# Patient Record
Sex: Female | Born: 1970 | Race: White | Hispanic: No | Marital: Married | Smoking: Former smoker
Health system: Southern US, Community
[De-identification: ages and names within clinical notes are randomized; demographics above are authoritative.]

## PROBLEM LIST (undated history)

## (undated) DIAGNOSIS — O24419 Gestational diabetes mellitus in pregnancy, unspecified control: Secondary | ICD-10-CM

## (undated) DIAGNOSIS — E785 Hyperlipidemia, unspecified: Secondary | ICD-10-CM

## (undated) HISTORY — DX: Gestational diabetes mellitus in pregnancy, unspecified control: O24.419

## (undated) HISTORY — DX: Hyperlipidemia, unspecified: E78.5

---
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
## 1997-08-29 ENCOUNTER — Encounter: Admission: RE | Admit: 1997-08-29 | Discharge: 1997-08-29 | Payer: Self-pay | Admitting: Family Medicine

## 1997-11-12 ENCOUNTER — Other Ambulatory Visit: Admission: RE | Admit: 1997-11-12 | Discharge: 1997-11-12 | Payer: Self-pay | Admitting: Obstetrics and Gynecology

## 1998-01-25 HISTORY — PX: TUBAL LIGATION: SHX77

## 1998-05-29 ENCOUNTER — Other Ambulatory Visit: Admission: RE | Admit: 1998-05-29 | Discharge: 1998-05-29 | Payer: Self-pay | Admitting: *Deleted

## 1998-10-24 ENCOUNTER — Encounter: Admission: RE | Admit: 1998-10-24 | Discharge: 1999-01-22 | Payer: Self-pay | Admitting: Obstetrics and Gynecology

## 1998-12-04 ENCOUNTER — Inpatient Hospital Stay (HOSPITAL_COMMUNITY): Admission: AD | Admit: 1998-12-04 | Discharge: 1998-12-09 | Payer: Self-pay | Admitting: Obstetrics and Gynecology

## 1998-12-04 ENCOUNTER — Encounter (INDEPENDENT_AMBULATORY_CARE_PROVIDER_SITE_OTHER): Payer: Self-pay

## 1998-12-05 ENCOUNTER — Encounter: Payer: Self-pay | Admitting: Obstetrics and Gynecology

## 1999-03-30 ENCOUNTER — Encounter: Admission: RE | Admit: 1999-03-30 | Discharge: 1999-03-30 | Payer: Self-pay | Admitting: Family Medicine

## 1999-04-20 ENCOUNTER — Encounter: Admission: RE | Admit: 1999-04-20 | Discharge: 1999-04-20 | Payer: Self-pay | Admitting: Family Medicine

## 1999-09-11 ENCOUNTER — Encounter: Admission: RE | Admit: 1999-09-11 | Discharge: 1999-09-11 | Payer: Self-pay | Admitting: Family Medicine

## 2001-09-25 ENCOUNTER — Encounter (INDEPENDENT_AMBULATORY_CARE_PROVIDER_SITE_OTHER): Payer: Self-pay | Admitting: *Deleted

## 2001-09-25 LAB — CONVERTED CEMR LAB

## 2001-10-05 ENCOUNTER — Encounter: Admission: RE | Admit: 2001-10-05 | Discharge: 2001-10-05 | Payer: Self-pay | Admitting: Family Medicine

## 2002-07-20 ENCOUNTER — Encounter: Admission: RE | Admit: 2002-07-20 | Discharge: 2002-07-20 | Payer: Self-pay | Admitting: Family Medicine

## 2002-08-23 ENCOUNTER — Encounter: Payer: Self-pay | Admitting: Internal Medicine

## 2002-08-23 ENCOUNTER — Encounter: Admission: RE | Admit: 2002-08-23 | Discharge: 2002-08-23 | Payer: Self-pay | Admitting: Internal Medicine

## 2002-10-02 ENCOUNTER — Encounter: Payer: Self-pay | Admitting: Internal Medicine

## 2002-10-02 ENCOUNTER — Encounter: Admission: RE | Admit: 2002-10-02 | Discharge: 2002-10-02 | Payer: Self-pay | Admitting: Internal Medicine

## 2005-03-28 ENCOUNTER — Emergency Department (HOSPITAL_COMMUNITY): Admission: EM | Admit: 2005-03-28 | Discharge: 2005-03-29 | Payer: Self-pay | Admitting: Emergency Medicine

## 2006-03-25 ENCOUNTER — Encounter (INDEPENDENT_AMBULATORY_CARE_PROVIDER_SITE_OTHER): Payer: Self-pay | Admitting: *Deleted

## 2008-06-10 ENCOUNTER — Other Ambulatory Visit: Admission: RE | Admit: 2008-06-10 | Discharge: 2008-06-10 | Payer: Self-pay | Admitting: Obstetrics and Gynecology

## 2010-01-25 DIAGNOSIS — E785 Hyperlipidemia, unspecified: Secondary | ICD-10-CM

## 2010-01-25 HISTORY — DX: Hyperlipidemia, unspecified: E78.5

## 2010-06-12 NOTE — Discharge Summary (Signed)
[facility]  Patient:    Karen Diaz                     MRN: [id number] Adm. Date:  [id number] Disc. Date: 12/09/98 Attending:  Shaune Spittle Dictator:   Vance Gather Duplantis, C.N.M.                           Discharge Summary  ADMISSION DIAGNOSES:          1. Intrauterine pregnancy at 35-5/7ths weeks.                               2. Pre-eclampsia.                               3. Gestational diabetes, diet controlled.                               4. History of placenta previa with this pregnancy,                                  resolved at 25 weeks.                               5. History of prior cesarean section with wound                                  disruption.                               6. Desires repeat cesarean section.                               7. Multiparity.                               8. Desires bilateral tubal ligation.  DISCHARGE DIAGNOSES:          1. Intrauterine pregnancy at 35-5/7ths weeks.                               2. Pre-eclampsia, resolving.                               3. Gestational diabetes, diet controlled.                               4. History of placenta previa with this pregnancy,                                  resolved at 25 weeks.  5. History of prior cesarean section with wound                                  disruption.                               6. Desires repeat cesarean section.                               7. Multiparity.                               8. Desires bilateral tubal ligation.                               9. Bottle feeding.  PROCEDURES THIS ADMISSION:    1. Repeat low transverse cesarean section for                                  delivery of a viable female infant with Apgars 8 and                                  9, weighing 4 pounds 11 ounces, by Dr. Trudie Reed on December 06, 1998.                             2. Bilateral tubal ligation for sterilization during                                  cesarean section on December 06, 1998, by                                  Dr. Trudie Reed.  HOSPITAL COURSE:              [name] is a 40 year old married white female, gravida 2, para 0-1-0-1, at 35-5/7ths weeks who was admitted with elevated blood pressures and proteinuria for magnesium sulfate therapy.  Her pregnancy has been complicated by gestational diabetes, history of pre-eclampsia and previous C-section with wound disruption.  An ultrasound on November 10 revealed less than 10th percentile growth of the fetus and no suitable pocket for amniocentesis. s a result a long discussion was held with the patient by Dr. Trudie Reed and the  plan was made to proceed with a cesarean section delivery when she was 36 weeks on December 06, 1998.  She was then delivered of a viable female infant who had Apgars of 8 and 9, weighed 4 pounds 11 ounces, by Dr. Trudie Reed on December 06, 1998. She also underwent bilateral tubal ligation for sterilization without complications during the same surgery.  Please see operative note for details. Postoperatively she was maintained  on magnesium sulfate for 24 hours and started diuresing well and had stable blood pressures.  Her blood pressures have continued to be stable off the magnesium sulfate and she is doing well.  She is ambulating, voiding and eating without difficulty.  She is afebrile.  She is also bottle feeding without difficulty.  She is deemed ready for discharge today.  DISCHARGE INSTRUCTIONS:       Per [facility] OB/GYN handout.  DISCHARGE FOLLOWUP:           To come by the office for staple removal on December 11, 1998, and for routine six-week postpartum appointment.  DISCHARGE LABORATORY DATA:    Platelets are 187, hemoglobin 10.4, WBC count is .6.  DISCHARGE MEDICATIONS:        1. Motrin 600  mg p.o. q.6h. p.r.n. pain.                               2. Tylox 1-2 p.o. q.4-6h. p.r.n. pain.                               3. Prenatal vitamins daily. DD:  12/09/98 TD:  12/09/98 Job: [id number]

## 2010-06-12 NOTE — Op Note (Signed)
[facility]  Patient:    Karen Diaz                     MRN: [id number] Proc. Date: 12/06/98 Adm. Date:  [id number] Attending:  Dierdre Forth Pearline                           Operative Report  PREOPERATIVE DIAGNOSIS:       Intrauterine pregnancy at 36 weeks, preeclampsia,  small for gestational age infant, grade III placenta, diet controlled gestational diabetes, previous cesarean section declining VBAC, and desiring permanent sterilization.  POSTOPERATIVE DIAGNOSIS:      Intrauterine pregnancy at 36 weeks, preeclampsia,  small for gestational age infant, grade III placenta, diet controlled gestational diabetes, previous cesarean section declining VBAC, and desiring permanent sterilization.  OPERATION:                    Repeat low transverse cesarean section and postpartum tubal ligation.  SURGEON:                      Georgina Peer, M.D.  ASSISTANTVance Gather Duplantis, C.N.M.  ANESTHESIA:                   Spinal by Gretta Cool., M.D.  ESTIMATED BLOOD LOSS:         800 cc.  COMPLICATIONS:                None.  FINDINGS:                     4 pound 11 ounce female at 64, Apgars 8 and 9. Tubes and ovaries normal.  INDICATIONS:                  The patient is a 40 year old, gravida 2, para 0-1-0-1, who presented with increased blood pressure and proteinuria, and headache with visual changes.  She was diagnosed with preeclampsia and started on magnesium sulfate.  The patient had normal PIH labs.  A 24-hour urine revealed greater than 300 mg but less than 1 gram of protein in the mild preeclampsia range.  Fetal heart rate was reactive.  Hemoglobin A1C was normal.  Fasting blood sugar was less than 100 and two-hour postprandial less than 125.  An ultrasound on November 10, revealed the infant to be greater than two weeks less than the expected gestational age with the fetal weight less than the  6th percentile.  With all of these factors, and the patient declining VBAC, a repeat cesarean section was planned.  DESCRIPTION OF PROCEDURE:     The patient was taken to the operating room and given a spinal anesthetic.  Foley catheter had been placed in her bladder.  She had been given 1 gram of Cefotan preoperatively.  After an adequate anesthesia and the patient being prepped and draped sterilely, a transverse incision was made above her previous scar which was wide.  This was taken down to the peritoneal cavity in the normal Pfannenstiel manner.  The peritoneum was opened sharply and widened.  Bleeders were cauterized.  Bladder blade was placed over the bladder after a transverse bladder flap was created.  A transverse uterine incision was made and membranes were ruptured yeilding clear fluid.  At 1419  a female infant was delivered with the aid of fundal pressure in the vertex presentation.  Female cried spontaneously.  The cord was doubly clamped and cut and the infant was handed to the pediatric team in attendance.  Cord blood was taken and the placenta was delivered and sent to pathology.  Apgars were assigned at 8 and 9.  The uterine  incision was closed with a running locked suture of Vicryl.  Interrupted sutures and mattress sutures of Vicryl were used to control bleeding along the incisional edge which was then hemostatic.  Tubal ligation was then performed by visualizing the left tube, tracing it to its fimbriated end.  A small cautery hole was placed in the mesosalpinx below the tube.  Two ties of plain gut were placed 3 cm apart and the midportion excised.  There was no bleeding.  The right tube had more prominent mesosalpingeal vessels.  Therefore a knuckle was elevated.  No hole was placed in the mesosalpinx.  Two plain ties were tied around the knuckle and the  midportion excised.  There was no bleeding.  The ovaries bilaterally appeared normal.  There appeared  to be a small desidual reaction on the left ovary. Again the incision was inspected and found to be hemostatic.  The blood and debris were removed with irrigation.  The muscles were approximated in the midline and the peritoneum was not closed.  The fascia was separated from the subcutaneous which had been scarred to allow better mobility and skin edge opposition.  The fascia was closed with Vicryl suture.  The old scar was excised and bleeders were cauterized. Subcutaneous tissue was approximated with interrupted sutures of plain gut. The skin was closed with skin staples.  Sponge, needle, and instrument counts were correct.  The patient tolerating the procedure well was transferred to the recovery room in good condition. DD:  12/06/98 TD:  12/08/98 Job: [id number]

## 2010-09-29 ENCOUNTER — Other Ambulatory Visit: Payer: Self-pay | Admitting: Obstetrics and Gynecology

## 2010-09-29 ENCOUNTER — Other Ambulatory Visit (HOSPITAL_COMMUNITY)
Admission: RE | Admit: 2010-09-29 | Discharge: 2010-09-29 | Disposition: A | Discharge status: Home / Self Care | Payer: BC Managed Care – PPO | Source: Ambulatory Visit | Attending: Obstetrics and Gynecology | Admitting: Obstetrics and Gynecology

## 2010-09-29 DIAGNOSIS — Z01419 Encounter for gynecological examination (general) (routine) without abnormal findings: Secondary | ICD-10-CM | POA: Insufficient documentation

## 2011-02-01 ENCOUNTER — Encounter: Payer: Self-pay | Admitting: Emergency Medicine

## 2011-02-01 ENCOUNTER — Ambulatory Visit (INDEPENDENT_AMBULATORY_CARE_PROVIDER_SITE_OTHER): Payer: BC Managed Care – PPO | Admitting: Emergency Medicine

## 2011-02-01 VITALS — BP 121/77 | HR 98 | Temp 98.0°F | Ht 63.0 in | Wt 129.0 lb

## 2011-02-01 DIAGNOSIS — N926 Irregular menstruation, unspecified: Secondary | ICD-10-CM

## 2011-02-01 DIAGNOSIS — E785 Hyperlipidemia, unspecified: Secondary | ICD-10-CM

## 2011-02-01 DIAGNOSIS — Z0000 Encounter for general adult medical examination without abnormal findings: Secondary | ICD-10-CM

## 2011-02-01 NOTE — Patient Instructions (Signed)
It was wonderful to meet you!  I would like to recheck your cholesterol next month.  Please schedule a lab appointment for the blood draw.  I will send a letter with the results and let you know if we need to make any changes.  I'm sorry to hear about your difficulties with your menstrual period.  I will follow along with your gynecologist.  Please let me know if I can help in any way.  When you turn 41, please call the office [phone]) for information on scheduling a screening colonoscopy.

## 2011-02-01 NOTE — Assessment & Plan Note (Signed)
Diagnosed in 08/2010.  Has made some lifestyle changes.  Will recheck lipid panel beginning of February.  If continues to be elevated will discuss referral to nutrition vs starting medication.  Goal LDL <160 given no other risk factors.

## 2011-02-01 NOTE — Assessment & Plan Note (Signed)
Given family of history of colon cancer, with 1st degree relative diagnosed around age 40, will start screening colonoscopy at age 49. Pt to call clinic after her 53th birthday for information on scheduling a colonoscopy.  Had pap smear at her gynecologist in 08/2010.

## 2011-02-01 NOTE — Assessment & Plan Note (Signed)
Currently being managed by Dr. Richardson Dopp of [facility].  Scheduled for ultrasound tomorrow.  Will continue to follow along.  Does have family history of menopause in late 30s/early 67s.

## 2011-02-01 NOTE — Progress Notes (Signed)
  Subjective:    Patient ID: Karen Diaz, female    DOB: [birth year], 41 y.o.   MRN: [id number]  HPI [name] is here to establish care.  1. Hyperlipidemia: This was diagnosed on a blood test in 08/2010.  Since then, she has started walking more (about 20 minutes a day) and has tried to eat healthier with no fried foods, and cutting out soda and mayonnaise. Would like to avoid medication if possible.  No history of hypertension or heart diease.  Never smoker.  Does have family history of heart diease in grandparents, but no first degree relative.  No current symptoms of CP, SOB, edema.  2. Irregular menses: Patient had some heavy bleeding in 10-11/2010.  Had a more normal period in December.  This is being workup by Karen Diaz, her gynecologist.  She has an ultrasound scheduled for tomorrow.  Recent pap was reported as normal.  Does have family history of menopause around age 77.  34. Family Hx of cancer: Strong family history of cancer, particularly colon cancer, in father, uncles, aunt, and cousin.   Review of Systems Please see HPI, otherwise negative.    Objective:   Physical Exam Vitals: reviewed HEENT: AT/[place], PERRL, EOMI, sclera white, MMM, oropharynx clear without erythema or exudate, TMs clear bilaterally, no cervical LAD CV: RRR, no murmurs Pulm: CTAB, no wheezes, rales Abd: soft, +BS, NTND, no hepatosplenomegaly Ext: no peripheral edema, 2+ pedal pulses      Assessment & Plan:

## 2011-03-11 ENCOUNTER — Other Ambulatory Visit: Payer: Self-pay | Admitting: Obstetrics and Gynecology

## 2011-03-11 DIAGNOSIS — Z1231 Encounter for screening mammogram for malignant neoplasm of breast: Secondary | ICD-10-CM

## 2011-03-11 DIAGNOSIS — Z803 Family history of malignant neoplasm of breast: Secondary | ICD-10-CM

## 2011-03-30 ENCOUNTER — Ambulatory Visit
Admission: RE | Admit: 2011-03-30 | Discharge: 2011-03-30 | Disposition: A | Discharge status: Home / Self Care | Payer: BC Managed Care – PPO | Source: Ambulatory Visit | Attending: Obstetrics and Gynecology | Admitting: Obstetrics and Gynecology

## 2011-03-30 DIAGNOSIS — Z803 Family history of malignant neoplasm of breast: Secondary | ICD-10-CM

## 2011-03-30 DIAGNOSIS — Z1231 Encounter for screening mammogram for malignant neoplasm of breast: Secondary | ICD-10-CM

## 2011-04-05 ENCOUNTER — Ambulatory Visit (INDEPENDENT_AMBULATORY_CARE_PROVIDER_SITE_OTHER): Payer: BC Managed Care – PPO | Admitting: Emergency Medicine

## 2011-04-05 ENCOUNTER — Encounter: Payer: Self-pay | Admitting: Emergency Medicine

## 2011-04-05 ENCOUNTER — Ambulatory Visit
Admission: RE | Admit: 2011-04-05 | Discharge: 2011-04-05 | Disposition: A | Discharge status: Home / Self Care | Payer: BC Managed Care – PPO | Source: Ambulatory Visit | Attending: Family Medicine | Admitting: Family Medicine

## 2011-04-05 VITALS — BP 118/78 | HR 83 | Ht 63.0 in | Wt 126.0 lb

## 2011-04-05 DIAGNOSIS — M25521 Pain in right elbow: Secondary | ICD-10-CM

## 2011-04-05 DIAGNOSIS — L989 Disorder of the skin and subcutaneous tissue, unspecified: Secondary | ICD-10-CM | POA: Insufficient documentation

## 2011-04-05 DIAGNOSIS — M25529 Pain in unspecified elbow: Secondary | ICD-10-CM

## 2011-04-05 MED ORDER — MELOXICAM 15 MG PO TABS: 15.0000 mg | ORAL_TABLET | Freq: Every day | ORAL | Status: DC

## 2011-04-05 NOTE — Assessment & Plan Note (Signed)
With history of elbow fracture in childhood there is concern for a piece of cartilage or bone to be in the joint space, as well as development of arthritis.  Will obtain complete x-ray of right elbow.  Will also start meloxicam 15mg  daily for 2 weeks than as needed.  Pt to follow up in 2 weeks to go over results of x-ray and decide on further action (injection, referral to [facility]).

## 2011-04-05 NOTE — Assessment & Plan Note (Signed)
Concern for basal cell carcinoma.  Given location of lesion, will refer to dermatology for biopsy.

## 2011-04-05 NOTE — Patient Instructions (Signed)
It was nice to see you, I'm sorry your having pain.  For the elbow - we are going to get some x-rays to look at the bones and joint.  There may be a piece of cartilage in the joint preventing you from straightening your arm.  I have also sent a prescription for meloxicam (super motrin) to [facility], please take this everyday for the next 2 weeks, then as needed.  I will see you back in clinic in 2-3 weeks to go over the x-rays and decide on the next step.  For the nose - I have placed a referral to dermatology for biopsy.  You should be getting a call to schedule the appointment in the next 2 weeks.

## 2011-04-05 NOTE — Progress Notes (Signed)
  Subjective:    Patient ID: Karen Diaz, female    DOB: Aug 05, 1970, 41 y.o.   MRN: [id number]  HPI [name] is here today for right elbow pain and a skin lesion.  1. Elbow pain: The pain is located on the posterior aspect of the elbow and is achy in nature.  Has been present for a long time, but worsening over the last 4-6 weeks.  There is pain on extreme flexion of the elbow and she is unable to completely straighten the elbow.  She did break her elbow when she was 6 and again when she was 7.  She does not recall where exactly the breaks were located.  The pain is worsened with movements and will spread to include her shoulder and wrist/thumb.  Occasionally has numbness/tingling in her thumb with excessive activity; feel like her hand is getting weaker.  Has tried tylenol, motrin, and heat at home with minimal to no improvement in pain.  2. Skin lesion: located on bridge of nose.  Has been present for about 1 year.  Thinks it has been getting darker.  States there is sometimes an underlying bump that comes and goes.  She does get lots of sun exposure, but she does use sunscreen.   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

## 2011-10-28 ENCOUNTER — Other Ambulatory Visit (HOSPITAL_COMMUNITY)
Admission: RE | Admit: 2011-10-28 | Discharge: 2011-10-28 | Disposition: A | Discharge status: Home / Self Care | Payer: BC Managed Care – PPO | Source: Ambulatory Visit | Attending: Obstetrics and Gynecology | Admitting: Obstetrics and Gynecology

## 2011-10-28 DIAGNOSIS — Z1151 Encounter for screening for human papillomavirus (HPV): Secondary | ICD-10-CM | POA: Insufficient documentation

## 2011-10-28 DIAGNOSIS — Z01419 Encounter for gynecological examination (general) (routine) without abnormal findings: Secondary | ICD-10-CM | POA: Insufficient documentation

## 2011-11-17 ENCOUNTER — Encounter: Payer: Self-pay | Admitting: Emergency Medicine

## 2011-11-17 ENCOUNTER — Ambulatory Visit (INDEPENDENT_AMBULATORY_CARE_PROVIDER_SITE_OTHER): Payer: BC Managed Care – PPO | Admitting: Emergency Medicine

## 2011-11-17 VITALS — BP 135/82 | HR 92 | Temp 98.2°F | Ht 62.5 in | Wt 127.0 lb

## 2011-11-17 DIAGNOSIS — J069 Acute upper respiratory infection, unspecified: Secondary | ICD-10-CM

## 2011-11-17 DIAGNOSIS — E785 Hyperlipidemia, unspecified: Secondary | ICD-10-CM

## 2011-11-17 DIAGNOSIS — Z0000 Encounter for general adult medical examination without abnormal findings: Secondary | ICD-10-CM

## 2011-11-17 LAB — LIPID PANEL
Cholesterol: 335 mg/dL — ABNORMAL HIGH (ref 0–200)
HDL: 64 mg/dL (ref >39)
LDL Cholesterol: 243 mg/dL — ABNORMAL HIGH (ref 0–99)
Total CHOL/HDL Ratio: 5.2 Ratio
Triglycerides: 139 mg/dL (ref <150)
VLDL: 28 mg/dL (ref 0–40)

## 2011-11-17 LAB — COMPREHENSIVE METABOLIC PANEL
ALT: 11 U/L (ref 0–35)
AST: 16 U/L (ref 0–37)
Albumin: 4.6 g/dL (ref 3.5–5.2)
Alkaline Phosphatase: 66 U/L (ref 39–117)
BUN: 7 mg/dL (ref 6–23)
CO2: 26 mEq/L (ref 19–32)
Calcium: 9.5 mg/dL (ref 8.4–10.5)
Chloride: 104 mEq/L (ref 96–112)
Creat: 0.62 mg/dL (ref 0.50–1.10)
Glucose, Bld: 97 mg/dL (ref 70–99)
Potassium: 4.3 mEq/L (ref 3.5–5.3)
Sodium: 138 mEq/L (ref 135–145)
Total Bilirubin: 0.6 mg/dL (ref 0.3–1.2)
Total Protein: 7.1 g/dL (ref 6.0–8.3)

## 2011-11-17 MED ORDER — FLUTICASONE PROPIONATE 50 MCG/ACT NA SUSP: 2.0000 | Freq: Every day | NASAL | Status: DC

## 2011-11-17 MED ORDER — SODIUM CHLORIDE 0.65 % NA SOLN: 2.0000 | Freq: Three times a day (TID) | NASAL | Status: DC

## 2011-11-17 NOTE — Patient Instructions (Signed)
It was nice to see you again! Please call [facility] about scheduling a colonoscopy.  You should not need a referral from me to do this.  If they say you do, just call the office and I will place the referral. I will send you a letter with the results of your lab work in the next 1-2 weeks.  If something is wrong, I will call you. For the cold symptoms - STOP using the Afrin spray - START using Flonase every day - START using nasal saline spray 3-4x per day to try and wash out the sinuses - if you start having fevers or the nasal discharge starts looking like pus or smelling really bad, give me a call and we will start some antibiotics.  Follow up as needed.

## 2011-11-17 NOTE — Progress Notes (Signed)
  Subjective:    Patient ID: Karen Diaz, female    DOB: 1970/12/30, 41 y.o.   MRN: [id number]  HPI Karen Diaz is here for annual exam and cold symptoms.  1. Cold symptoms: Reports having cold symptoms for the last month.  Initially started with chest congestion, cough and loss of voice x1 week.  Then it moved to her head with nasal congestion, pressure in the sinuses and intermittent ear discomfort.  Has tried OTC Sudafed, Afrin (none for last 4 days), tylenol cold and sinus and robitussin.  Nasal discharge is green in color.  Has had some chills, but no fevers.  I have reviewed and updated the following as appropriate: allergies and current medications FHx: uncle died of colon cancer at age 70, cousin died of colon cancer at age 53, dad died of esophageal cancer in early 21s SHx: never smoker   Review of Systems See HPI    Objective:   Physical Exam BP 135/82  Pulse 92  Temp 98.2 F (36.8 C) (Oral)  Ht 5' 2.5" (1.588 m)  Wt 127 lb (57.607 kg)  BMI 22.86 kg/m2 Gen: alert, cooperative, NAD, sounds congested HEENT: AT/[place], sclera white, PERRL, mild edema of nasal turbinates, MMM, no pharyngeal erythema or exudate, TMs mildly retracted bilaterally, no tenderness over the sinuses Neck: supple, no LAD CV: RRR, no murmurs Pulm: CTAB, no wheezes or rales Ext: no edema     Assessment & Plan:

## 2011-11-17 NOTE — Assessment & Plan Note (Signed)
Will check fasting lipid panel today.  

## 2011-11-17 NOTE — Assessment & Plan Note (Signed)
Will start screening colonoscopy now given multiple uncles and cousins that have died of colon cancer around age 40.  [facility] phone number given.  If they want a referral, she will let me know.

## 2011-11-17 NOTE — Assessment & Plan Note (Signed)
Primarily nasal symptoms and head congestion.  No signs for bacterial sinusitis.  Discussed using Flonase and nasal saline to help clear out the sinuses.  If febrile or having pus-like nasal discharge she is to call clinic and will likely need a 10 day course of Augmentin.

## 2011-11-18 ENCOUNTER — Telehealth: Payer: Self-pay | Admitting: Emergency Medicine

## 2011-11-18 DIAGNOSIS — Z1211 Encounter for screening for malignant neoplasm of colon: Secondary | ICD-10-CM

## 2011-11-18 MED ORDER — ATORVASTATIN CALCIUM 40 MG PO TABS: 40.0000 mg | ORAL_TABLET | Freq: Every day | ORAL | Status: DC

## 2011-11-18 NOTE — Telephone Encounter (Signed)
Called and left message on patient's voicemail regarding lab results.  Will start Lipitor 40mg  daily and recheck cholesterol in 3 months.  Informed to stop medication and return to clinic if having muscle aches or dark urine with lipitor.  Suspect that she will need Crestor and may need combination therapy.  Goal LDL <160 based on current guidelines.  Instructed to call clinic if she has any questions.

## 2011-11-18 NOTE — Telephone Encounter (Signed)
Will fwd. To Dr.Booth for advise. Lorenda Hatchet, Renato Battles

## 2011-11-18 NOTE — Telephone Encounter (Signed)
Was put on Lipitor and doesn't want to take it and wants to know if she can do some kind of diet that she can do to lower it herself - pls advise as to what kind of diet would be best  Also - cannot make appt for colonoscopy - has to be referred by her PCP

## 2011-11-19 NOTE — Telephone Encounter (Signed)
Called and discussed concerns with patient.  She would like to try diet and exercise before starting a medication.  She does have multiple aunts on her mom's side with high cholesterol, at least one of which cannot tolerate statins.  Discussed diet and exercise changes.  Also discussed red yeast rice.  Will redraw lipid panel in about 3 months.  Follow up with me shortly after the lab draw to decide on starting medicine or not.  Also placed referral to GI for early colonoscopy screening given family history.

## 2011-11-24 ENCOUNTER — Encounter: Payer: Self-pay | Admitting: Gastroenterology

## 2011-12-09 ENCOUNTER — Ambulatory Visit: Payer: BC Managed Care – PPO

## 2011-12-30 ENCOUNTER — Encounter: Payer: Self-pay | Admitting: Gastroenterology

## 2011-12-30 ENCOUNTER — Ambulatory Visit (AMBULATORY_SURGERY_CENTER): Payer: BC Managed Care – PPO | Admitting: *Deleted

## 2011-12-30 VITALS — Ht 62.5 in | Wt 128.0 lb

## 2011-12-30 DIAGNOSIS — Z1211 Encounter for screening for malignant neoplasm of colon: Secondary | ICD-10-CM

## 2011-12-30 DIAGNOSIS — Z800 Family history of malignant neoplasm of digestive organs: Secondary | ICD-10-CM

## 2011-12-30 MED ORDER — SUPREP BOWEL PREP KIT 17.5-3.13-1.6 GM/177ML PO SOLN: ORAL | Status: DC

## 2012-01-13 ENCOUNTER — Encounter: Payer: Self-pay | Admitting: Gastroenterology

## 2012-01-13 ENCOUNTER — Ambulatory Visit (AMBULATORY_SURGERY_CENTER): Payer: BC Managed Care – PPO | Admitting: Gastroenterology

## 2012-01-13 VITALS — BP 117/69 | HR 77 | Temp 98.1°F | Resp 15 | Ht 62.5 in | Wt 128.0 lb

## 2012-01-13 DIAGNOSIS — Z800 Family history of malignant neoplasm of digestive organs: Secondary | ICD-10-CM

## 2012-01-13 DIAGNOSIS — Z1211 Encounter for screening for malignant neoplasm of colon: Secondary | ICD-10-CM

## 2012-01-13 MED ORDER — SODIUM CHLORIDE 0.9 % IV SOLN: 500.0000 mL | INTRAVENOUS | Status: DC

## 2012-01-13 NOTE — Progress Notes (Signed)
Patient did not have preoperative order for IV antibiotic SSI prophylaxis. (G8918)  Patient did not experience any of the following events: a burn prior to discharge; a fall within the facility; wrong site/side/patient/procedure/implant event; or a hospital transfer or hospital admission upon discharge from the facility. (G8907)  

## 2012-01-13 NOTE — Op Note (Signed)
[facility] [address] [place], [address]   COLONOSCOPY PROCEDURE REPORT  PATIENT: Karen, Diaz  MR#: [id number] BIRTHDATE: 1970/07/16 , 41  yrs. old GENDER: Female ENDOSCOPIST: Louis Meckel, MD REFERRED BY: PROCEDURE DATE:  01/13/2012 PROCEDURE:   Colonoscopy, diagnostic ASA CLASS:   Class I INDICATIONS:Patient's family history of colon cancer, distant relatives: 2paternal uncles and paternal first cousin with colon cancer at age 37 or below. MEDICATIONS: MAC sedation, administered by CRNA and propofol (Diprivan) 150mg  IV  DESCRIPTION OF PROCEDURE:   After the risks benefits and alternatives of the procedure were thoroughly explained, informed consent was obtained.  A digital rectal exam revealed no abnormalities of the rectum.   The LB [id number]  endoscope was introduced through the anus and advanced to the cecum, which was identified by both the appendix and ileocecal valve. No adverse events experienced.   The quality of the prep was Suprep excellent The instrument was then slowly withdrawn as the colon was fully examined.      COLON FINDINGS: A normal appearing cecum, ileocecal valve, and appendiceal orifice were identified.  The ascending, hepatic flexure, transverse, splenic flexure, descending, sigmoid colon and rectum appeared unremarkable.  No polyps or cancers were seen. Retroflexed views revealed no abnormalities. The time to cecum=2 minutes 20 seconds.  Withdrawal time=6 minutes 0 seconds.  The scope was withdrawn and the procedure completed. COMPLICATIONS: There were no complications.  ENDOSCOPIC IMPRESSION: Normal colon  RECOMMENDATIONS: 1.  Colonoscopy 10 years    eSigned:  Louis Meckel, MD 01/13/2012 11:20 AM   cc: Despina Hick MD

## 2012-01-13 NOTE — Progress Notes (Signed)
Propofol given over incremental dosages 

## 2012-01-13 NOTE — Patient Instructions (Addendum)
YOU HAD AN ENDOSCOPIC PROCEDURE TODAY AT THE [facility]: Refer to the procedure report that was given to you for any specific questions about what was found during the examination.  If the procedure report does not answer your questions, please call your gastroenterologist to clarify.  If you requested that your care partner not be given the details of your procedure findings, then the procedure report has been included in a sealed envelope for you to review at your convenience later.  YOU SHOULD EXPECT: Some feelings of bloating in the abdomen. Passage of more gas than usual.  Walking can help get rid of the air that was put into your GI tract during the procedure and reduce the bloating. If you had a lower endoscopy (such as a colonoscopy or flexible sigmoidoscopy) you may notice spotting of blood in your stool or on the toilet paper. If you underwent a bowel prep for your procedure, then you may not have a normal bowel movement for a few days.  DIET: Your first meal following the procedure should be a light meal and then it is ok to progress to your normal diet.  A half-sandwich or bowl of soup is an example of a good first meal.  Heavy or fried foods are harder to digest and may make you feel nauseous or bloated.  Likewise meals heavy in dairy and vegetables can cause extra gas to form and this can also increase the bloating.  Drink plenty of fluids but you should avoid alcoholic beverages for 24 hours.  ACTIVITY: Your care partner should take you home directly after the procedure.  You should plan to take it easy, moving slowly for the rest of the day.  You can resume normal activity the day after the procedure however you should NOT DRIVE or use heavy machinery for 24 hours (because of the sedation medicines used during the test).    SYMPTOMS TO REPORT IMMEDIATELY: A gastroenterologist can be reached at any hour.  During normal business hours, 8:30 AM to 5:00 PM Monday through Friday,  call [phone].  After hours and on weekends, please call the GI answering service at [phone] who will take a message and have the physician on call contact you.   Following lower endoscopy (colonoscopy or flexible sigmoidoscopy):  Excessive amounts of blood in the stool  Significant tenderness or worsening of abdominal pains  Swelling of the abdomen that is new, acute  Fever of 100F or higher  FOLLOW UP: If any biopsies were taken you will be contacted by phone or by letter within the next 1-3 weeks.  Call your gastroenterologist if you have not heard about the biopsies in 3 weeks.  Our staff will call the home number listed on your records the next business day following your procedure to check on you and address any questions or concerns that you may have at that time regarding the information given to you following your procedure. This is a courtesy call and so if there is no answer at the home number and we have not heard from you through the emergency physician on call, we will assume that you have returned to your regular daily activities without incident.  SIGNATURES/CONFIDENTIALITY: You and/or your care partner have signed paperwork which will be entered into your electronic medical record.  These signatures attest to the fact that that the information above on your After Visit Summary has been reviewed and is understood.  Full responsibility of the confidentiality of this   discharge information lies with you and/or your care-partner.   Thank-you for choosing us for your medical needs. 

## 2012-01-14 ENCOUNTER — Telehealth: Payer: Self-pay | Admitting: *Deleted

## 2012-01-14 NOTE — Telephone Encounter (Signed)
  Follow up Call-  Call back number 01/13/2012  Post procedure Call Back phone  # [phone]  Permission to leave phone message Yes     Patient questions:  Do you have a fever, pain , or abdominal swelling? no Pain Score  0 *  Have you tolerated food without any problems? yes  Have you been able to return to your normal activities? yes  Do you have any questions about your discharge instructions: Diet   no Medications  no Follow up visit  no  Do you have questions or concerns about your Care? no  Actions: * If pain score is 4 or above: No action needed, pain <4.

## 2012-02-19 ENCOUNTER — Encounter (HOSPITAL_COMMUNITY): Payer: Self-pay | Admitting: *Deleted

## 2012-02-19 ENCOUNTER — Emergency Department (HOSPITAL_COMMUNITY)
Admission: EM | Admit: 2012-02-19 | Discharge: 2012-02-19 | Disposition: A | Discharge status: Home / Self Care | Payer: BC Managed Care – PPO | Source: Home / Self Care | Attending: Family Medicine | Admitting: Family Medicine

## 2012-02-19 DIAGNOSIS — Z23 Encounter for immunization: Secondary | ICD-10-CM

## 2012-02-19 DIAGNOSIS — S0033XA Contusion of nose, initial encounter: Secondary | ICD-10-CM

## 2012-02-19 DIAGNOSIS — S0003XA Contusion of scalp, initial encounter: Secondary | ICD-10-CM

## 2012-02-19 DIAGNOSIS — S0083XA Contusion of other part of head, initial encounter: Secondary | ICD-10-CM

## 2012-02-19 MED ORDER — HYDROCODONE-ACETAMINOPHEN 5-325 MG PO TABS: 1.0000 | ORAL_TABLET | Freq: Four times a day (QID) | ORAL | Status: DC | PRN

## 2012-02-19 MED ORDER — TETANUS-DIPHTH-ACELL PERTUSSIS 5-2.5-18.5 LF-MCG/0.5 IM SUSP
INTRAMUSCULAR | Status: AC
  Filled 2012-02-19: qty 0.5

## 2012-02-19 MED ORDER — TETANUS-DIPHTH-ACELL PERTUSSIS 5-2.5-18.5 LF-MCG/0.5 IM SUSP
0.5000 mL | Freq: Once | INTRAMUSCULAR | Status: AC
  Administered 2012-02-19: 0.5 mL via INTRAMUSCULAR

## 2012-02-19 NOTE — ED Provider Notes (Signed)
History     CSN: [id number]  Arrival date & time 02/19/12  1111   First MD Initiated Contact with Patient 02/19/12 1114      Chief Complaint  Patient presents with  . Fall  . Facial Injury    (Consider location/radiation/quality/duration/timing/severity/associated sxs/prior treatment) Patient is a 42 y.o. female presenting with facial injury. The history is provided by the patient.  Facial Injury  The incident occurred yesterday. The incident occurred at home. The injury mechanism was a direct blow (fell against treadmill striking nose.). The injury was related to play-equipment. The wounds were self-inflicted. There is an injury to the face. The pain is mild. It is unlikely that a foreign body is present. Associated symptoms include headaches.    Past Medical History  Diagnosis Date  . Hyperlipidemia 2012  . Gestational diabetes     Past Surgical History  Procedure Date  . Appendectomy 1978  . Cesarean section 1993, 2000  . Tubal ligation 2000    Family History  Problem Relation Age of Onset  . Diabetes Maternal Grandmother   . Heart failure Maternal Grandmother   . Hypertension Maternal Grandmother   . Diabetes Maternal Grandfather   . Hypertension Maternal Grandfather   . Diabetes Paternal Grandmother   . Diabetes Paternal Grandfather   . Esophageal cancer Paternal Grandfather   . Asthma Mother   . Cancer Father     colon/breast  . Esophageal cancer Father   . Colon cancer Paternal Uncle   . Stomach cancer Neg Hx   . Rectal cancer Neg Hx     History  Substance Use Topics  . Smoking status: Former Games developer  . Smokeless tobacco: Never Used  . Alcohol Use: 0.0 oz/week     Comment: Socially    OB History    Grav Para Term Preterm Abortions TAB SAB Ect Mult Living                  Review of Systems  Constitutional: Negative.   HENT: Positive for nosebleeds. Negative for congestion and rhinorrhea.   Eyes: Negative.   Neurological: Positive for  headaches. Negative for dizziness and facial asymmetry.    Allergies  Flagyl  Home Medications   Current Outpatient Rx  Name  Route  Sig  Dispense  Refill  . BIOTIN 5 MG PO CAPS   Oral   Take 1 capsule by mouth daily.         . OMEGA-3 FATTY ACIDS 1000 MG PO CAPS   Oral   Take 2 g by mouth daily.         Marland Kitchen FLUTICASONE PROPIONATE 50 MCG/ACT NA SUSP   Nasal   Place 2 sprays into the nose daily.   16 g   6   . HYDROCODONE-ACETAMINOPHEN 5-325 MG PO TABS   Oral   Take 1 tablet by mouth every 6 (six) hours as needed for pain.   10 tablet   0   . PSYLLIUM 0.52 G PO CAPS   Oral   Take 1.04 g by mouth daily.         . SODIUM CHLORIDE 0.65 % NA SOLN   Nasal   Place 2 sprays into the nose 3 (three) times daily.   15 mL   12   . VITAMIN B-12 500 MCG PO TABS   Oral   Take 500 mcg by mouth 2 (two) times a week.         Marland Kitchen VITAMIN C 500 MG  PO TABS   Oral   Take 500 mg by mouth daily.           BP 144/97  Pulse 97  Temp 98.2 F (36.8 C) (Oral)  Resp 16  SpO2 98%  LMP 02/12/2012  Physical Exam  Nursing note and vitals reviewed. Constitutional: She is oriented to person, place, and time. She appears well-developed and well-nourished.  HENT:  Head: Normocephalic.  Right Ear: External ear normal.  Left Ear: External ear normal.  Nose: Mucosal edema present. No epistaxis.    Mouth/Throat: Oropharynx is clear and moist.       Minor left abrasion, diffuse soreness, no deformity, old blood right nares. Mouth and teeth intact.  Eyes: Conjunctivae normal and EOM are normal. Pupils are equal, round, and reactive to light.  Neck: Normal range of motion. Neck supple.  Neurological: She is alert and oriented to person, place, and time.  Skin: Skin is warm and dry.    ED Course  Procedures (including critical care time)  Labs Reviewed - No data to display No results found.   1. Nasal contusion       MDM         Linna Hoff, MD 02/19/12  [phone]

## 2012-02-19 NOTE — ED Notes (Signed)
Was playing around with son last night and he tried to pick her up, which made her fall forward, hitting her nose/face on treadmill.  States she "saw black" for a few seconds, "but I could hear my son the whole time".  Immediately had epistaxis, which resolved.  C/O HA and significant pain across nose and into cheeks.  Denies vomiting, but states she has had nausea; she believes it to be from the HA.  Denies vision changes.  Took IBU last night.  Has been applying ice.  Small scratch noted to bridge of nose with some swelling.  Last Tdap > 5 yrs.

## 2012-07-24 ENCOUNTER — Other Ambulatory Visit: Payer: Self-pay

## 2012-07-24 DIAGNOSIS — Z1231 Encounter for screening mammogram for malignant neoplasm of breast: Secondary | ICD-10-CM

## 2012-08-14 ENCOUNTER — Ambulatory Visit
Admission: RE | Admit: 2012-08-14 | Discharge: 2012-08-14 | Disposition: A | Discharge status: Home / Self Care | Payer: BC Managed Care – PPO | Source: Ambulatory Visit

## 2012-08-14 DIAGNOSIS — Z1231 Encounter for screening mammogram for malignant neoplasm of breast: Secondary | ICD-10-CM

## 2012-11-20 ENCOUNTER — Ambulatory Visit (INDEPENDENT_AMBULATORY_CARE_PROVIDER_SITE_OTHER): Payer: BC Managed Care – PPO | Admitting: *Deleted

## 2012-11-20 ENCOUNTER — Encounter: Payer: BC Managed Care – PPO | Admitting: Emergency Medicine

## 2012-11-20 DIAGNOSIS — Z23 Encounter for immunization: Secondary | ICD-10-CM

## 2012-11-30 ENCOUNTER — Ambulatory Visit (INDEPENDENT_AMBULATORY_CARE_PROVIDER_SITE_OTHER): Payer: BC Managed Care – PPO | Admitting: Emergency Medicine

## 2012-11-30 ENCOUNTER — Encounter: Payer: Self-pay | Admitting: Emergency Medicine

## 2012-11-30 ENCOUNTER — Ambulatory Visit
Admission: RE | Admit: 2012-11-30 | Discharge: 2012-11-30 | Disposition: A | Discharge status: Home / Self Care | Payer: BC Managed Care – PPO | Source: Ambulatory Visit | Attending: Family Medicine | Admitting: Family Medicine

## 2012-11-30 VITALS — BP 119/82 | HR 80 | Temp 98.5°F | Wt 140.0 lb

## 2012-11-30 DIAGNOSIS — E785 Hyperlipidemia, unspecified: Secondary | ICD-10-CM

## 2012-11-30 DIAGNOSIS — R635 Abnormal weight gain: Secondary | ICD-10-CM

## 2012-11-30 DIAGNOSIS — M25473 Effusion, unspecified ankle: Secondary | ICD-10-CM

## 2012-11-30 DIAGNOSIS — M25472 Effusion, left ankle: Secondary | ICD-10-CM

## 2012-11-30 DIAGNOSIS — E663 Overweight: Secondary | ICD-10-CM | POA: Insufficient documentation

## 2012-11-30 LAB — CBC WITH DIFFERENTIAL/PLATELET
Basophils Absolute: 0.1 10*3/uL (ref 0.0–0.1)
Basophils Relative: 1 % (ref 0–1)
Eosinophils Absolute: 0.1 10*3/uL (ref 0.0–0.7)
Eosinophils Relative: 1 % (ref 0–5)
HCT: 36 % (ref 36.0–46.0)
Hemoglobin: 11.9 g/dL — ABNORMAL LOW (ref 12.0–15.0)
Lymphocytes Relative: 30 % (ref 12–46)
Lymphs Abs: 2.7 10*3/uL (ref 0.7–4.0)
MCH: 27.5 pg (ref 26.0–34.0)
MCHC: 33.1 g/dL (ref 30.0–36.0)
MCV: 83.3 fL (ref 78.0–100.0)
Monocytes Absolute: 0.4 10*3/uL (ref 0.1–1.0)
Monocytes Relative: 5 % (ref 3–12)
Neutro Abs: 5.7 10*3/uL (ref 1.7–7.7)
Neutrophils Relative %: 63 % (ref 43–77)
Platelets: 395 10*3/uL (ref 150–400)
RBC: 4.32 MIL/uL (ref 3.87–5.11)
RDW: 13.9 % (ref 11.5–15.5)
WBC: 9 10*3/uL (ref 4.0–10.5)

## 2012-11-30 LAB — LIPID PANEL
Cholesterol: 274 mg/dL — ABNORMAL HIGH (ref 0–200)
HDL: 61 mg/dL (ref >39)
LDL Cholesterol: 186 mg/dL — ABNORMAL HIGH (ref 0–99)
Total CHOL/HDL Ratio: 4.5 Ratio
Triglycerides: 133 mg/dL (ref <150)
VLDL: 27 mg/dL (ref 0–40)

## 2012-11-30 LAB — POCT GLYCOSYLATED HEMOGLOBIN (HGB A1C): Hemoglobin A1C: 5.8

## 2012-11-30 MED ORDER — ATORVASTATIN CALCIUM 10 MG PO TABS: 10.0000 mg | ORAL_TABLET | Freq: Every day | ORAL | Status: DC

## 2012-11-30 NOTE — Patient Instructions (Signed)
It was nice to see you!  I will call you with the lab results early next week.  Please start the lipitor.  If it causes side effects, stop taking it.  You can get the x-ray of your ankle anytime over at [facility] or [facility].  Follow up in 2 months.

## 2012-11-30 NOTE — Assessment & Plan Note (Addendum)
13 pounds over last 1 year. Will check TSH, cbc and A1c. May be related to peri-menopause. Will call with results. Follow up in 2 months.

## 2012-11-30 NOTE — Assessment & Plan Note (Signed)
Reports injury 2 months ago with slow healing. No focal tenderness and exam benign. Suspect she may have torn a ligament and now has some calcification or loose body. Will check x-ray. Encouraged ROM exercises, icing, ibuprofen and elevation. Follow up in 2 months.

## 2012-11-30 NOTE — Progress Notes (Signed)
  Subjective:    Patient ID: Karen Diaz, female    DOB: [birth year], 42 y.o.   MRN: [id number]  HPI Karen Diaz is here for her annual exam.  I have reviewed and updated the following as appropriate: allergies, current medications, past family history, past medical history, past social history, past surgical history and problem list PMHx: hyperlipidemia  FHx: diabetes SHx: non smoker  Hyperlipidemia She has tried life style changes without improvement.  She tried red yeast rice and developed headaches and muscle aches.  She is willing to try a low-dose of a medicine at this time.  Weight gain She reports gaining weight over the last year.  Has also noticed low energy and fatigue.  Also with increased moodiness and frustration.  She states she is probably eating healthier now that she has before.  Walking on the treadmill 30-45 minutes 3x a week.  Left ankle swelling She states that about 2 months ago, she injured her left ankle.  She did not get evaluated at that time.  She has been able to bear weight the entire time.  It has slowly been getting better.  Still will swell and feel "uncomfortable" if she walks too much or if she moves it a certain way.    Review of Systems Patient Information Form: Screening and ROS  Do you feel safe in relationships? yes  Review of Symptoms  General:  Negative for nexplained weight loss, fever Skin: Negative for new or changing mole, sore that won't heal HEENT: Negative for trouble hearing, trouble seeing, ringing in ears, mouth sores, hoarseness, change in voice, dysphagia. CV:  Negative for chest pain, dyspnea, edema, palpitations Resp: Negative for cough, dyspnea, hemoptysis GI: Negative for nausea, vomiting, diarrhea, constipation, abdominal pain, melena, hematochezia. GU: Negative for dysuria, incontinence, urinary hesitance, hematuria, vaginal or penile discharge, polyuria, sexual difficulty, lumps in testicle or breasts MSK:  Negative for muscle cramps or aches, joint pain or +swelling Neuro: Negative for headaches, weakness, numbness, dizziness, passing out/fainting Psych: Negative for depression, +anxiety and stress, memory problems      Objective:   Physical Exam BP 119/82  Pulse 80  Temp(Src) 98.5 F (36.9 C) (Oral)  Wt 140 lb (63.504 kg)  LMP 11/08/2012 Gen: alert, cooperative, NAD HEENT: AT/[place], sclera white, MMM, PERRL Neck: supple, thyroid normal, fullness in bilateral submandibular region CV: RRR, no murmurs Pulm: CTAB, no wheezes or rales Abd: +BS, soft, NTND Ext: no edema, 2+ DP pulses bilaterally Left ankle: no erythema or edema; tender over deltoid ligament; no joint laxity; full strength and full ROM     Assessment & Plan:

## 2012-11-30 NOTE — Assessment & Plan Note (Signed)
Lipid panel today. Will start lipitor 10mg  daily.  She will stop if she developed muscle aches.

## 2012-12-01 LAB — TSH: TSH: 1.132 u[IU]/mL (ref 0.350–4.500)

## 2012-12-04 ENCOUNTER — Telehealth: Payer: Self-pay | Admitting: Emergency Medicine

## 2012-12-04 ENCOUNTER — Encounter: Payer: Self-pay | Admitting: Emergency Medicine

## 2012-12-04 NOTE — Telephone Encounter (Signed)
Called and reviewed results with patient.  Cholesterol is improved from last year, but still elevated.  She will continue with the lipitor.  TSH was normal.  Will send a letter with the results at her request.

## 2013-07-04 ENCOUNTER — Encounter: Payer: Self-pay | Admitting: Family Medicine

## 2013-07-04 ENCOUNTER — Ambulatory Visit (INDEPENDENT_AMBULATORY_CARE_PROVIDER_SITE_OTHER): Payer: BC Managed Care – PPO | Admitting: Family Medicine

## 2013-07-04 ENCOUNTER — Telehealth: Payer: Self-pay | Admitting: Family Medicine

## 2013-07-04 ENCOUNTER — Ambulatory Visit (HOSPITAL_COMMUNITY)
Admission: RE | Admit: 2013-07-04 | Discharge: 2013-07-04 | Disposition: A | Discharge status: Home / Self Care | Payer: BC Managed Care – PPO | Source: Ambulatory Visit | Attending: Family Medicine | Admitting: Family Medicine

## 2013-07-04 VITALS — BP 106/75 | HR 91 | Temp 97.9°F | Ht 62.5 in | Wt 146.0 lb

## 2013-07-04 DIAGNOSIS — M542 Cervicalgia: Secondary | ICD-10-CM

## 2013-07-04 DIAGNOSIS — J069 Acute upper respiratory infection, unspecified: Secondary | ICD-10-CM

## 2013-07-04 DIAGNOSIS — E785 Hyperlipidemia, unspecified: Secondary | ICD-10-CM

## 2013-07-04 LAB — CBC WITH DIFFERENTIAL/PLATELET
Basophils Absolute: 0.1 10*3/uL (ref 0.0–0.1)
Basophils Relative: 1 % (ref 0–1)
Eosinophils Absolute: 0.2 10*3/uL (ref 0.0–0.7)
Eosinophils Relative: 2 % (ref 0–5)
HCT: 34.9 % — ABNORMAL LOW (ref 36.0–46.0)
Hemoglobin: 11.6 g/dL — ABNORMAL LOW (ref 12.0–15.0)
Lymphocytes Relative: 29 % (ref 12–46)
Lymphs Abs: 2.6 10*3/uL (ref 0.7–4.0)
MCH: 27 pg (ref 26.0–34.0)
MCHC: 33.2 g/dL (ref 30.0–36.0)
MCV: 81.4 fL (ref 78.0–100.0)
Monocytes Absolute: 0.4 10*3/uL (ref 0.1–1.0)
Monocytes Relative: 4 % (ref 3–12)
Neutro Abs: 5.6 10*3/uL (ref 1.7–7.7)
Neutrophils Relative %: 64 % (ref 43–77)
Platelets: 431 10*3/uL — ABNORMAL HIGH (ref 150–400)
RBC: 4.29 MIL/uL (ref 3.87–5.11)
RDW: 15.3 % (ref 11.5–15.5)
WBC: 8.8 10*3/uL (ref 4.0–10.5)

## 2013-07-04 LAB — LDL CHOLESTEROL, DIRECT: Direct LDL: 210 mg/dL — ABNORMAL HIGH

## 2013-07-04 MED ORDER — AMOXICILLIN-POT CLAVULANATE 875-125 MG PO TABS: 1.0000 | ORAL_TABLET | Freq: Two times a day (BID) | ORAL | Status: DC

## 2013-07-04 MED ORDER — FLUTICASONE PROPIONATE 50 MCG/ACT NA SUSP: 2.0000 | Freq: Every day | NASAL | Status: DC

## 2013-07-04 MED ORDER — CYCLOBENZAPRINE HCL 5 MG PO TABS: 5.0000 mg | ORAL_TABLET | Freq: Three times a day (TID) | ORAL | Status: DC | PRN

## 2013-07-04 NOTE — Telephone Encounter (Signed)
Left message on voicemail. Proposito, Giovanna S  

## 2013-07-04 NOTE — Progress Notes (Signed)
   Subjective:    Patient ID: Karen Diaz, female    DOB: 1970/08/22, 43 y.o.   MRN: [id number]  HPI Karen Diaz is a 43 y.o. Caucasian female presents today for same-day appointment for upper respiratory like symptoms and left-sided neck pain.  URI like symptoms: Patient states that her symptoms started last Thursday. She's tried over-the-counter sinus medications and nasal drops, without relief. She endorses headache, facial pressure, congestion, mild cough, myalgias, fevers, chills, fatigue and sneezing. She has been able to tolerate food and drink without difficulty. She had one episode of diarrhea. No sick contacts.  Neck pain: Patient stated that she started having back pain between her shoulder please that has now moved up to left-sided neck pain. She states she's never had neck or back pain prior. She does not recall injuring her neck. He doesn't recall any recent lifting or activity that would've caused her neck to hurt. She endorses pain with movement of her neck especially looking to the right.  Review of Systems Negative, with the exception of above mentioned in HPI     Objective:   Physical Exam BP 106/75  Pulse 91  Temp(Src) 97.9 F (36.6 C) (Oral)  Ht 5' 2.5" (1.588 m)  Wt 146 lb (66.225 kg)  BMI 26.26 kg/m2  LMP 07/04/2013 Gen: Afebrile, well-developed, well-nourished, pleasant female. HEENT: AT. [place]. Bilateral TM visualized and normal in appearance. Bilateral eyes without injections or icterus. MMM. Bilateral nares with erythema and swelling.. Throat with mild erythema, no exudates. CV: RRR , no murmur Chest: Rhonchorous airways. Rhonchi appreciated upper lobes posterior exam. No wheezing or crackles. Abd: Soft. Thin. NTND. BS positive. No Masses palpated.  Ext: No erythema. No edema.  Skin: No rashes, purpura or petechiae.  MSK: No erythema or swelling. Tissue texture changes consistent with spasm left trapezium and posterior scalene. Full range of motion  of neck, with discomfort side bending to the right. Negative Kernig's and Brudzinski.

## 2013-07-04 NOTE — Assessment & Plan Note (Signed)
Patient stopped Lipitor 2 months ago because she thought it was causing her weight gain. She is due for lipid followup will be a direct LDL today.

## 2013-07-04 NOTE — Patient Instructions (Signed)

## 2013-07-04 NOTE — Assessment & Plan Note (Signed)
Left-sided neck pain possibly due to sinusitis. Negative meningeal signs. Patient given prescription for Flexeril with caution to use only at night and not while driving.

## 2013-07-04 NOTE — Telephone Encounter (Signed)
Please call Karen Diaz and let her know her chest x-ray was normal. Thanks

## 2013-07-04 NOTE — Assessment & Plan Note (Signed)
Patient with upper respiratory infection, with acute sinusitis. Mild concern over pneumonia with lung exam. Prescribe Flonase, Augmentin for 10 days. Chest x-ray rule out pneumonia.

## 2013-07-05 ENCOUNTER — Telehealth: Payer: Self-pay | Admitting: Family Medicine

## 2013-07-05 ENCOUNTER — Encounter: Payer: Self-pay | Admitting: Family Medicine

## 2013-07-05 NOTE — Telephone Encounter (Signed)
Please call Karen Diaz and tell her that her cholesterol is pretty high and she should be on a cholesterol lowering medicine. I know she stopped her stain prior, she needs to restart or talk to her PCP about trying another type of statin. Thanks.

## 2013-12-17 ENCOUNTER — Ambulatory Visit (INDEPENDENT_AMBULATORY_CARE_PROVIDER_SITE_OTHER): Payer: BC Managed Care – PPO | Admitting: *Deleted

## 2013-12-17 ENCOUNTER — Ambulatory Visit (INDEPENDENT_AMBULATORY_CARE_PROVIDER_SITE_OTHER): Payer: BC Managed Care – PPO | Admitting: Family Medicine

## 2013-12-17 ENCOUNTER — Encounter: Payer: Self-pay | Admitting: Family Medicine

## 2013-12-17 VITALS — BP 128/85 | HR 84 | Temp 98.5°F | Ht 62.5 in | Wt 147.9 lb

## 2013-12-17 DIAGNOSIS — Z0000 Encounter for general adult medical examination without abnormal findings: Secondary | ICD-10-CM

## 2013-12-17 DIAGNOSIS — R635 Abnormal weight gain: Secondary | ICD-10-CM | POA: Diagnosis not present

## 2013-12-17 DIAGNOSIS — R399 Unspecified symptoms and signs involving the genitourinary system: Secondary | ICD-10-CM

## 2013-12-17 DIAGNOSIS — Z23 Encounter for immunization: Secondary | ICD-10-CM

## 2013-12-17 LAB — LIPID PANEL
Cholesterol: 296 mg/dL — ABNORMAL HIGH (ref 0–200)
HDL: 46 mg/dL (ref >39)
Total CHOL/HDL Ratio: 6.4 Ratio
Triglycerides: 682 mg/dL — ABNORMAL HIGH (ref <150)

## 2013-12-17 LAB — POCT URINALYSIS DIPSTICK
Bilirubin, UA: NEGATIVE
Glucose, UA: NEGATIVE
Ketones, UA: NEGATIVE
Leukocytes, UA: NEGATIVE
Nitrite, UA: NEGATIVE
Protein, UA: NEGATIVE
Spec Grav, UA: <=1.005
Urobilinogen, UA: 0.2
pH, UA: 6.5

## 2013-12-17 LAB — POCT UA - MICROSCOPIC ONLY

## 2013-12-17 LAB — POCT GLYCOSYLATED HEMOGLOBIN (HGB A1C): Hemoglobin A1C: 6.2

## 2013-12-17 NOTE — Assessment & Plan Note (Signed)
Patient reporting dysuria, urinary frequency, urinary urgency 1.5 weeks. Urinalysis is not concerning for infection. - Continue Pyridium as needed for symptom management

## 2013-12-17 NOTE — Assessment & Plan Note (Signed)
We'll check lipid panel and A1c today. - Patient sees gynecologist for Pap smears.

## 2013-12-17 NOTE — Patient Instructions (Signed)
It was nice to meet you today.  Keep up the exercise, try to increase to 5 times weekly.  Watch portion sizes and starchy foods.  Diabetes Mellitus and Food It is important for you to manage your blood sugar (glucose) level. Your blood glucose level can be greatly affected by what you eat. Eating healthier foods in the appropriate amounts throughout the day at about the same time each day will help you control your blood glucose level. It can also help slow or prevent worsening of your diabetes mellitus. Healthy eating may even help you improve the level of your blood pressure and reach or maintain a healthy weight.  HOW CAN FOOD AFFECT ME? Carbohydrates Carbohydrates affect your blood glucose level more than any other type of food. Your dietitian will help you determine how many carbohydrates to eat at each meal and teach you how to count carbohydrates. Counting carbohydrates is important to keep your blood glucose at a healthy level, especially if you are using insulin or taking certain medicines for diabetes mellitus. Alcohol Alcohol can cause sudden decreases in blood glucose (hypoglycemia), especially if you use insulin or take certain medicines for diabetes mellitus. Hypoglycemia can be a life-threatening condition. Symptoms of hypoglycemia (sleepiness, dizziness, and disorientation) are similar to symptoms of having too much alcohol.  If your health care provider has given you approval to drink alcohol, do so in moderation and use the following guidelines:  Women should not have more than one drink per day, and men should not have more than two drinks per day. One drink is equal to:  12 oz of beer.  5 oz of wine.  1 oz of hard liquor.  Do not drink on an empty stomach.  Keep yourself hydrated. Have water, diet soda, or unsweetened iced tea.  Regular soda, juice, and other mixers might contain a lot of carbohydrates and should be counted. WHAT FOODS ARE NOT RECOMMENDED? As you make  food choices, it is important to remember that all foods are not the same. Some foods have fewer nutrients per serving than other foods, even though they might have the same number of calories or carbohydrates. It is difficult to get your body what it needs when you eat foods with fewer nutrients. Examples of foods that you should avoid that are high in calories and carbohydrates but low in nutrients include:  Trans fats (most processed foods list trans fats on the Nutrition Facts label).  Regular soda.  Juice.  Candy.  Sweets, such as cake, pie, doughnuts, and cookies.  Fried foods. WHAT FOODS CAN I EAT? Have nutrient-rich foods, which will nourish your body and keep you healthy. The food you should eat also will depend on several factors, including:  The calories you need.  The medicines you take.  Your weight.  Your blood glucose level.  Your blood pressure level.  Your cholesterol level. You also should eat a variety of foods, including:  Protein, such as meat, poultry, fish, tofu, nuts, and seeds (lean animal proteins are best).  Fruits.  Vegetables.  Dairy products, such as milk, cheese, and yogurt (low fat is best).  Breads, grains, pasta, cereal, rice, and beans.  Fats such as olive oil, trans fat-free margarine, canola oil, avocado, and olives. DOES EVERYONE WITH DIABETES MELLITUS HAVE THE SAME MEAL PLAN? Because every person with diabetes mellitus is different, there is not one meal plan that works for everyone. It is very important that you meet with a dietitian who will help  you create a meal plan that is just right for you. Document Released: 10/08/2004 Document Revised: 01/16/2013 Document Reviewed: 12/08/2012 [facility] Patient Information 2015 [place], [place]. This information is not intended to replace advice given to you by your health care provider. Make sure you discuss any questions you have with your health care provider.

## 2013-12-17 NOTE — Assessment & Plan Note (Signed)
Encourage patient to increase exercise to 5 times weekly. Encouraged portion control and diet. - Referral to Dr. Jenne Campus for nutrition counseling.

## 2013-12-17 NOTE — Progress Notes (Signed)
   Subjective:   Karen Diaz is a 43 y.o. female with a history of frequent UTIs, hyperlipidemia here for annual physical.  1. Dysuria: Patient reports about a week and a half of dysuria, urinary frequency, urinary urgency. She is a history of multiple UTIs and used to take Macrobid prophylactically. She now takes cranberry supplementation daily. Last Friday she got Pyridium from the pharmacy which helped with her symptoms. She denies any fever or back pain.  2. Hyperlipidemia, weight gain: Patient used to take Lipitor but reports she stopped taking it due to weight gain.  Patient is working toward losing weight currently. She reports she exercises about 3 times weekly. She like to talk to the dietitian.  Review of Systems:  Per HPI. All other systems reviewed and are negative.   PMH, PSH, Medications, Allergies, and FmHx reviewed and updated in EMR.  Social History: former smoker  Objective:  BP 128/85 mmHg  Pulse 84  Temp(Src) 98.5 F (36.9 C) (Oral)  Ht 5' 2.5" (1.588 m)  Wt 147 lb 14.4 oz (67.087 kg)  BMI 26.60 kg/m2  LMP 10/29/2013  Gen:  43 y.o. female in NAD HEENT: NCAT, MMM, EOMI, PERRL, anicteric sclerae CV: RRR, no MRG, no JVD Resp: Non-labored, CTAB, no wheezes noted Abd: Soft, NTND, BS present, no guarding or organomegaly, no CVA tenderness Ext: WWP, no edema MSK: Full ROM, strength intact Neuro: Alert and oriented, speech normal    Urinalysis    Component Value Date/Time   BILIRUBINUR NEG 12/17/2013 1350   PROTEINUR NEG 12/17/2013 1350   UROBILINOGEN 0.2 12/17/2013 1350   NITRITE NEG 12/17/2013 1350   LEUKOCYTESUR Negative 12/17/2013 1350      Assessment:     Karen Diaz is a 43 y.o. female here for annual physical, dysuria, weight gain.    Plan:     See problem list for problem-specific plans.   Lavon Paganini, MD PGY-1,  [facility] 12/17/2013  1:50 PM

## 2013-12-17 NOTE — Addendum Note (Signed)
Addended by: Martin[place], BONNIE on: 12/17/2013 06:01 PM   Modules accepted: Orders

## 2013-12-19 ENCOUNTER — Telehealth: Payer: Self-pay | Admitting: Family Medicine

## 2013-12-19 NOTE — Telephone Encounter (Signed)
Called patient to discuss lab results.  Hgb A1c in pre-diabetes raange and Triglycerides elevated.  Encouraged patient to eat less startchy foods.  Patient planning to call Dr. Jenne Campus soon for an appointment to discuss nutrition  ASCVD 10 year risk  Of 1.7%.  No need to treat for hyperlipidemia currently.  No UTI on UA.   Lavon Paganini, MD, MPH PGY-1,  [facility] 12/19/2013 10:07 AM

## 2014-05-20 ENCOUNTER — Ambulatory Visit (INDEPENDENT_AMBULATORY_CARE_PROVIDER_SITE_OTHER): Payer: 59 | Admitting: Family Medicine

## 2014-05-20 ENCOUNTER — Encounter: Payer: Self-pay | Admitting: Family Medicine

## 2014-05-20 VITALS — BP 113/78 | HR 101 | Temp 98.3°F | Ht 62.5 in | Wt 147.0 lb

## 2014-05-20 DIAGNOSIS — R399 Unspecified symptoms and signs involving the genitourinary system: Secondary | ICD-10-CM | POA: Diagnosis not present

## 2014-05-20 LAB — POCT UA - MICROSCOPIC ONLY: Epithelial cells, urine per micros: <5

## 2014-05-20 LAB — POCT URINALYSIS DIPSTICK
Bilirubin, UA: NEGATIVE
Blood, UA: NEGATIVE
Glucose, UA: NEGATIVE
Ketones, UA: NEGATIVE
Nitrite, UA: NEGATIVE
Protein, UA: NEGATIVE
Spec Grav, UA: 1.02
Urobilinogen, UA: 0.2
pH, UA: 6.5

## 2014-05-20 MED ORDER — CEPHALEXIN 500 MG PO CAPS: 500.0000 mg | ORAL_CAPSULE | Freq: Two times a day (BID) | ORAL | Status: DC

## 2014-05-20 NOTE — Assessment & Plan Note (Addendum)
Patient with symptoms of UTI. This appears to be a recurrent issue. Benign abdominal exam. UA with moderate leuks so will treat with keflex for UTI. UCx to be sent to confirm diagnosis. Would consider restarting prophyactic medication if she continues to have recurrence. Given return precautions.

## 2014-05-20 NOTE — Patient Instructions (Signed)
You appear to have a UTI.  We will culture this to confirm the diagnosis. We will treat this with keflex for 7 days.  If you develop fever, back pain, nausea, chills, or blood in your urine please let us know.

## 2014-05-20 NOTE — Progress Notes (Signed)
Patient ID: Karen Diaz, female   DOB: 1970/08/06, 44 y.o.   MRN: [id number]  Tommi Rumps, MD Phone: [phone]  Karen Diaz is a 44 y.o. female who presents today for same day appointment.  Presents for concern for UTI. States she gets these often. Has had several days of suprapubic discomfort, bladder fullness, and bladder spasms. Endorses frequency, urgency, and mild dysuria. Denies back pain, fevers, and hematuria. Notes was previously treated with macrobid through her OB to use prior to sexual activity and baths. She ran out some time ago. She is sexually active with one partner (her husband). Sometimes uses condoms. No history of STD. No vaginal discharge.   ROS: Per HPI   Physical Exam Filed Vitals:   05/20/14 1415  BP: 113/78  Pulse: 101  Temp: 98.3 F (36.8 C)    Gen: Well NAD Abd: soft, NT, ND, no CVA tenderness   Assessment/Plan: Please see individual problem list.  Tommi Rumps, MD [facility] PGY-3

## 2014-05-23 ENCOUNTER — Telehealth: Payer: Self-pay | Admitting: Family Medicine

## 2014-05-23 ENCOUNTER — Encounter: Payer: Self-pay | Admitting: Family Medicine

## 2014-05-23 LAB — URINE CULTURE: Colony Count: >=100000

## 2014-05-23 NOTE — Telephone Encounter (Signed)
Informed of urine culture results. Finish course of keflex.

## 2014-12-26 ENCOUNTER — Other Ambulatory Visit: Payer: Self-pay | Admitting: Nurse Practitioner

## 2014-12-26 ENCOUNTER — Other Ambulatory Visit (HOSPITAL_COMMUNITY)
Admission: RE | Admit: 2014-12-26 | Discharge: 2014-12-26 | Disposition: A | Discharge status: Home / Self Care | Payer: 59 | Source: Ambulatory Visit | Attending: Nurse Practitioner | Admitting: Nurse Practitioner

## 2014-12-26 DIAGNOSIS — Z01419 Encounter for gynecological examination (general) (routine) without abnormal findings: Secondary | ICD-10-CM | POA: Diagnosis present

## 2014-12-26 DIAGNOSIS — Z1151 Encounter for screening for human papillomavirus (HPV): Secondary | ICD-10-CM | POA: Insufficient documentation

## 2014-12-30 ENCOUNTER — Other Ambulatory Visit: Payer: 59

## 2014-12-30 DIAGNOSIS — Z1231 Encounter for screening mammogram for malignant neoplasm of breast: Secondary | ICD-10-CM

## 2014-12-30 LAB — CYTOLOGY - PAP

## 2015-01-06 ENCOUNTER — Ambulatory Visit
Admission: RE | Admit: 2015-01-06 | Discharge: 2015-01-06 | Disposition: A | Discharge status: Home / Self Care | Payer: 59 | Source: Ambulatory Visit

## 2015-01-06 DIAGNOSIS — Z1231 Encounter for screening mammogram for malignant neoplasm of breast: Secondary | ICD-10-CM

## 2015-03-24 ENCOUNTER — Ambulatory Visit (HOSPITAL_COMMUNITY)
Admission: RE | Admit: 2015-03-24 | Discharge: 2015-03-24 | Disposition: A | Discharge status: Home / Self Care | Payer: BLUE CROSS/BLUE SHIELD | Source: Ambulatory Visit | Attending: Family Medicine | Admitting: Family Medicine

## 2015-03-24 ENCOUNTER — Encounter: Payer: Self-pay | Admitting: Family Medicine

## 2015-03-24 ENCOUNTER — Ambulatory Visit (INDEPENDENT_AMBULATORY_CARE_PROVIDER_SITE_OTHER): Payer: BLUE CROSS/BLUE SHIELD | Admitting: Family Medicine

## 2015-03-24 VITALS — BP 123/73 | HR 74 | Temp 98.8°F | Ht 63.0 in | Wt 147.0 lb

## 2015-03-24 DIAGNOSIS — R002 Palpitations: Secondary | ICD-10-CM | POA: Diagnosis not present

## 2015-03-24 DIAGNOSIS — E785 Hyperlipidemia, unspecified: Secondary | ICD-10-CM | POA: Diagnosis not present

## 2015-03-24 DIAGNOSIS — Z0000 Encounter for general adult medical examination without abnormal findings: Secondary | ICD-10-CM

## 2015-03-24 DIAGNOSIS — Z8632 Personal history of gestational diabetes: Secondary | ICD-10-CM | POA: Diagnosis not present

## 2015-03-24 DIAGNOSIS — Z87440 Personal history of urinary (tract) infections: Secondary | ICD-10-CM | POA: Diagnosis not present

## 2015-03-24 LAB — COMPLETE METABOLIC PANEL WITH GFR
ALT: 17 U/L (ref 6–29)
AST: 18 U/L (ref 10–30)
Albumin: 4.7 g/dL (ref 3.6–5.1)
Alkaline Phosphatase: 86 U/L (ref 33–115)
BUN: 6 mg/dL — ABNORMAL LOW (ref 7–25)
CO2: 24 mmol/L (ref 20–31)
Calcium: 10.5 mg/dL — ABNORMAL HIGH (ref 8.6–10.2)
Chloride: 101 mmol/L (ref 98–110)
Creat: 0.64 mg/dL (ref 0.50–1.10)
GFR, Est African American: >89 mL/min (ref >=60)
GFR, Est Non African American: >89 mL/min (ref >=60)
Glucose, Bld: 107 mg/dL — ABNORMAL HIGH (ref 65–99)
Potassium: 4.5 mmol/L (ref 3.5–5.3)
Sodium: 138 mmol/L (ref 135–146)
Total Bilirubin: 0.4 mg/dL (ref 0.2–1.2)
Total Protein: 7.6 g/dL (ref 6.1–8.1)

## 2015-03-24 LAB — LIPID PANEL
Cholesterol: 305 mg/dL — ABNORMAL HIGH (ref 125–200)
HDL: 56 mg/dL (ref >=46)
LDL Cholesterol: 201 mg/dL — ABNORMAL HIGH (ref <130)
Total CHOL/HDL Ratio: 5.4 Ratio — ABNORMAL HIGH (ref <=5.0)
Triglycerides: 239 mg/dL — ABNORMAL HIGH (ref <150)
VLDL: 48 mg/dL — ABNORMAL HIGH (ref <30)

## 2015-03-24 LAB — CBC
HCT: 38.3 % (ref 36.0–46.0)
Hemoglobin: 12.2 g/dL (ref 12.0–15.0)
MCH: 26.2 pg (ref 26.0–34.0)
MCHC: 31.9 g/dL (ref 30.0–36.0)
MCV: 82.4 fL (ref 78.0–100.0)
MPV: 11.3 fL (ref 8.6–12.4)
Platelets: 462 10*3/uL — ABNORMAL HIGH (ref 150–400)
RBC: 4.65 MIL/uL (ref 3.87–5.11)
RDW: 15 % (ref 11.5–15.5)
WBC: 9.1 10*3/uL (ref 4.0–10.5)

## 2015-03-24 LAB — TSH: TSH: 1.13 mIU/L

## 2015-03-24 LAB — POCT GLYCOSYLATED HEMOGLOBIN (HGB A1C): Hemoglobin A1C: 5.8

## 2015-03-24 NOTE — Progress Notes (Signed)
   Subjective:   Karen Diaz is a 45 y.o. female with a history of  hyperlipidemia here for for well woman/preventative visit  Acute Concerns:  Palpitations - worsening recently - intermittent - no CP, syncope, LE edema - sometimes it takes her breath away - occur 2-3 times per week - worried because CHF seems to run in her family   Recurrent UTIs - taking macrobid prophylactic before sex - getting 1-2 per year - hasnt decreased - followed by gynecology - not interested in urology referral currently - worried about if this is related to kidneys  HLD - medications: not currently taking atorvastatin   Diet: trying to eat better - more veggies, grilled and baked meats, no fried foods  Exercise: none recently except running after grandchildren, wants to start doing something  Sexual/Birth History: yes with husband, G2P2  Birth Control: none, s/p BTL  POA/Living Will: no   Review of Systems: Per HPI.  Social History: former smoker - quit 20 yrs ago  Immunization:  Tdap/TD: 2014  Influenza: declines  Pneumococcal: n/a  Herpes Zoster: n/a  Cancer Screening:  Pap Smear: 2016 - normal  Mammogram: family history of breast cancer, 2016 - normal  Colonoscopy: n/a  Dexa: n/a   Objective:  BP 123/73 mmHg  Pulse 74  Temp(Src) 98.8 F (37.1 C) (Oral)  Ht 5\' 3"  (1.6 m)  Wt 147 lb (66.679 kg)  BMI 26.05 kg/m2  LMP 02/08/2015  Gen:  45 y.o. female in NAD HEENT: NCAT, MMM, EOMI, PERRL, anicteric sclerae, OP clear, TMs clear b/l CV: RRR, no MRG, no JVD Resp: Non-labored, CTAB, no wheezes noted Abd: Soft, NTND, BS present, no guarding or organomegaly Ext: WWP, no edema MSK:  No obvious deformities Neuro: Alert and oriented, speech normal  EKG: NSR, without evidence of ischemia       Chemistry      Component Value Date/Time   NA 138 11/17/2011 1040   K 4.3 11/17/2011 1040   CL 104 11/17/2011 1040   CO2 26 11/17/2011 1040   BUN 7 11/17/2011 1040   CREATININE 0.62 11/17/2011 1040      Component Value Date/Time   CALCIUM 9.5 11/17/2011 1040   ALKPHOS 66 11/17/2011 1040   AST 16 11/17/2011 1040   ALT 11 11/17/2011 1040   BILITOT 0.6 11/17/2011 1040      Lab Results  Component Value Date   WBC 8.8 07/04/2013   HGB 11.6* 07/04/2013   HCT 34.9* 07/04/2013   MCV 81.4 07/04/2013   PLT 431* 07/04/2013   Lab Results  Component Value Date   TSH 1.132 11/30/2012   Lab Results  Component Value Date   HGBA1C 5.8 03/24/2015    Assessment & Plan:     Karen Diaz is a 45 y.o. female here for annual well woman/preventative exam.  Hyperlipidemia  Repeat lipid panel Consider statin pending ASCVD risk score  Healthcare maintenance  A1c, CMET, CBC today  Patient up-to-date on mammogram and Pap smear  Palpitations  EKG within normal limits   CBC, CMP, TSH collected today  Referral to cardiology for possible Holter monitor or cardiac monitoring  History of recurrent UTIs  Advised patient to continue Macrobid prophylaxis as prescribed by gynecology If continues to be an issue in the future or worsens , consider urology referral     Virginia Crews, MD MPH PGY-2,  [facility] 03/24/2015  12:12 PM

## 2015-03-24 NOTE — Assessment & Plan Note (Signed)
Repeat lipid panel Consider statin pending ASCVD risk score 

## 2015-03-24 NOTE — Assessment & Plan Note (Signed)
Advised patient to continue Macrobid prophylaxis as prescribed by gynecology If continues to be an issue in the future or worsens , consider urology referral

## 2015-03-24 NOTE — Patient Instructions (Addendum)
Nice to see you again today. We will get an EKG and refer you to cardiology for the palpitations. We are getting some lab work today and someone will call you or send you a letter with the results when they're available  Come back for your next wellness checkup in 1 year.  Take care, Dr. B  Things to do to keep yourself healthy  - Exercise at least 30-45 minutes a day, 3-4 days a week.  - Eat a low-fat diet with lots of fruits and vegetables, up to 7-9 servings per day.  - Seatbelts can save your life. Wear them always.  - Smoke detectors on every level of your home, check batteries every year.  - Eye Doctor - have an eye exam every 1-2 years  - Safe sex - if you may be exposed to STDs, use a condom.  - Alcohol -  If you drink, do it moderately, less than 2 drinks per day.  - [place]. Choose someone to speak for you if you are not able.  - Depression is common in our stressful world.If you're feeling down or losing interest in things you normally enjoy, please come in for a visit.  - Violence - If anyone is threatening or hurting you, please call immediately.

## 2015-03-24 NOTE — Assessment & Plan Note (Signed)
A1c, CMET, CBC today  Patient up-to-date on mammogram and Pap smear

## 2015-03-24 NOTE — Assessment & Plan Note (Signed)
EKG within normal limits   CBC, CMP, TSH collected today  Referral to cardiology for possible Holter monitor or cardiac monitoring

## 2015-03-26 ENCOUNTER — Encounter: Payer: Self-pay | Admitting: Family Medicine

## 2015-10-22 ENCOUNTER — Ambulatory Visit (INDEPENDENT_AMBULATORY_CARE_PROVIDER_SITE_OTHER): Payer: BLUE CROSS/BLUE SHIELD | Admitting: Family Medicine

## 2015-10-22 ENCOUNTER — Encounter: Payer: Self-pay | Admitting: Family Medicine

## 2015-10-22 ENCOUNTER — Other Ambulatory Visit: Payer: Self-pay | Admitting: Physician Assistant

## 2015-10-22 VITALS — BP 112/64 | HR 80 | Temp 98.7°F | Wt 156.0 lb

## 2015-10-22 DIAGNOSIS — M545 Low back pain, unspecified: Secondary | ICD-10-CM

## 2015-10-22 DIAGNOSIS — M546 Pain in thoracic spine: Secondary | ICD-10-CM | POA: Diagnosis not present

## 2015-10-22 DIAGNOSIS — R635 Abnormal weight gain: Secondary | ICD-10-CM | POA: Diagnosis not present

## 2015-10-22 DIAGNOSIS — M549 Dorsalgia, unspecified: Secondary | ICD-10-CM | POA: Insufficient documentation

## 2015-10-22 LAB — POCT URINALYSIS DIPSTICK
Bilirubin, UA: NEGATIVE
Blood, UA: NEGATIVE
Glucose, UA: NEGATIVE
Ketones, UA: NEGATIVE
Leukocytes, UA: NEGATIVE
Nitrite, UA: NEGATIVE
Protein, UA: NEGATIVE
Spec Grav, UA: 1.01
Urobilinogen, UA: 0.2
pH, UA: 6.5

## 2015-10-22 MED ORDER — MELOXICAM 7.5 MG PO TABS: 7.5000 mg | ORAL_TABLET | Freq: Every day | ORAL | 0 refills | Status: DC

## 2015-10-22 NOTE — Progress Notes (Signed)
   Subjective:   Karen Diaz is a 45 y.o. female with a history of HLD, irregular menses, history of recurrent UTIs here for back pain  Back pain Going on for ~2wks Doesn't feel like pinched nerve Like she has had in the past Started in R lower back Radiates to flank and down RLQ Constant dull ache Not worse with movement No injury or trauma No urinary symptoms except frequency  ?Pre-menopausal - [place], Fatigued, Hot flashes - Gaining weight - diet has not changed, not able to exercise due to fatigue - regular periods still, but spotting ~5 days afterward - Mom, aunt, MGM menopause in early 85s - PHQ12 neg - Increased hair loss, no constipation/diarrhea  Review of Systems:  Per HPI.   Social History:  former smoker  Objective:  BP 112/64   Pulse 80   Temp 98.7 F (37.1 C) (Oral)   Wt 156 lb (70.8 kg)   LMP 09/26/2015 (Exact Date)   BMI 27.63 kg/m   Gen:  45 y.o. female in NAD  HEENT: NCAT, MMM, EOMI, PERRL, anicteric sclerae CV: RRR, no MRG Resp: Non-labored, CTAB, no wheezes noted Abd: Soft, NTND, BS present, no guarding or organomegaly Ext: WWP, no edema MSK: Very mildly TTP over R flank, No CVAT, bilateral lower extremity strength and sensation intact, gait intact Neuro: Alert and oriented, speech normal    Assessment & Plan:     Karen Diaz is a 45 y.o. female here for   Back pain Suspect strained muscle No red flags such as fever, chills, history of cancer, history of IV drug use No urinary symptoms and clean UA so doubt pyelonephritis or nephrolithiasis Trial of mobic daily for 2 weeks Back exercises given Return precautions discussed Follow-up as needed  Weight gain Patient symptoms she describes are likely perimenopausal We'll check TSH given 9 pound weight gain and fatigue    Virginia Crews, MD MPH PGY-3,  [facility] 10/22/2015  2:31 PM

## 2015-10-22 NOTE — Assessment & Plan Note (Signed)
Suspect strained muscle No red flags such as fever, chills, history of cancer, history of IV drug use No urinary symptoms and clean UA so doubt pyelonephritis or nephrolithiasis Trial of mobic daily for 2 weeks Back exercises given Return precautions discussed Follow-up as needed

## 2015-10-22 NOTE — Assessment & Plan Note (Signed)
Patient symptoms she describes are likely perimenopausal We'll check TSH given 9 pound weight gain and fatigue

## 2015-10-22 NOTE — Patient Instructions (Signed)

## 2015-10-23 ENCOUNTER — Encounter: Payer: Self-pay | Admitting: Family Medicine

## 2015-10-23 LAB — TSH: TSH: 1.08 mIU/L

## 2016-03-01 ENCOUNTER — Other Ambulatory Visit: Payer: Self-pay | Admitting: Family Medicine

## 2016-03-01 DIAGNOSIS — Z1231 Encounter for screening mammogram for malignant neoplasm of breast: Secondary | ICD-10-CM

## 2016-03-26 ENCOUNTER — Ambulatory Visit
Admission: RE | Admit: 2016-03-26 | Discharge: 2016-03-26 | Disposition: A | Discharge status: Home / Self Care | Payer: BLUE CROSS/BLUE SHIELD | Source: Ambulatory Visit | Attending: Family Medicine | Admitting: Family Medicine

## 2016-03-26 DIAGNOSIS — Z1231 Encounter for screening mammogram for malignant neoplasm of breast: Secondary | ICD-10-CM

## 2016-04-05 ENCOUNTER — Encounter: Payer: BLUE CROSS/BLUE SHIELD | Admitting: Family Medicine

## 2016-04-26 ENCOUNTER — Ambulatory Visit (INDEPENDENT_AMBULATORY_CARE_PROVIDER_SITE_OTHER): Payer: BLUE CROSS/BLUE SHIELD | Admitting: Family Medicine

## 2016-04-26 ENCOUNTER — Encounter: Payer: Self-pay | Admitting: Family Medicine

## 2016-04-26 VITALS — BP 128/80 | HR 86 | Temp 98.3°F | Ht 63.0 in | Wt 154.6 lb

## 2016-04-26 DIAGNOSIS — E785 Hyperlipidemia, unspecified: Secondary | ICD-10-CM | POA: Diagnosis not present

## 2016-04-26 DIAGNOSIS — Z8639 Personal history of other endocrine, nutritional and metabolic disease: Secondary | ICD-10-CM

## 2016-04-26 DIAGNOSIS — E663 Overweight: Secondary | ICD-10-CM | POA: Diagnosis not present

## 2016-04-26 DIAGNOSIS — Z0000 Encounter for general adult medical examination without abnormal findings: Secondary | ICD-10-CM | POA: Diagnosis not present

## 2016-04-26 NOTE — Assessment & Plan Note (Signed)
Repeat lipid panel Consider statin pending ASCVD risk score

## 2016-04-26 NOTE — Progress Notes (Signed)
   Subjective:   Karen Diaz is a 46 y.o. female with a history of HLD, overweight, recurrent UTIs here for well woman/preventative visit  Acute Concerns:  HLD - medications: none - previously Rx'd Lipitor but didn't like the way it made her feel   Diet: cut out sodas, doesn't eat much bread, drinking more water  Exercise: treadmill for 30 min 3x/wk  Sexual/Birth History: G2P0202, currently sexually active with female partner - husband  Birth Control: s/p BTL LMP: 3/14  POA/Living Will: none - wants more information  Review of Systems: Per HPI.    PMH, PSH, Medications, Allergies, and FmHx reviewed and updated in EMR.  Social History: former smoker - 25 years ago  Immunization:  Tdap/TD: UTD  Influenza: n/a  Pneumococcal: n/a  Herpes Zoster: n/a  Cancer Screening:  Pap Smear: Normal in 12/2014  Mammogram: done in 03/2016  Colonoscopy: normal in 2013 - repeat in 10 yrs - may relatives with colon cancer in 21s and 88s.  Father recently passed from esophageal cancer (smoker)  Dexa: n/a   Objective:  BP 128/80   Pulse 86   Temp 98.3 F (36.8 C) (Oral)   Ht 5\' 3"  (1.6 m)   Wt 154 lb 9.6 oz (70.1 kg)   LMP 04/07/2016   SpO2 99%   BMI 27.39 kg/m   Gen:  46 y.o. female in NAD HEENT: NCAT, MMM, EOMI, PERRL, anicteric sclerae, OP clear, TMs clear b/l CV: RRR, no MRG Resp: Non-labored, CTAB, no wheezes noted Abd: Soft, NTND, BS present, no guarding or organomegaly Ext: WWP, no edema MSK: No obvious deformities, gait intact, strength intact in UE/LEs. Neuro: Alert and oriented, speech normal Psych: Appropriate groom and dress, appropriate affect and mood        Chemistry      Component Value Date/Time   NA 138 03/24/2015 1012   K 4.5 03/24/2015 1012   CL 101 03/24/2015 1012   CO2 24 03/24/2015 1012   BUN 6 (L) 03/24/2015 1012   CREATININE 0.64 03/24/2015 1012      Component Value Date/Time   CALCIUM 10.5 (H) 03/24/2015 1012   ALKPHOS 86 03/24/2015 1012    AST 18 03/24/2015 1012   ALT 17 03/24/2015 1012   BILITOT 0.4 03/24/2015 1012      Lab Results  Component Value Date   WBC 9.1 03/24/2015   HGB 12.2 03/24/2015   HCT 38.3 03/24/2015   MCV 82.4 03/24/2015   PLT 462 (H) 03/24/2015   Lab Results  Component Value Date   TSH 1.08 10/22/2015   Lab Results  Component Value Date   HGBA1C 5.8 03/24/2015    Assessment & Plan:     Karen Diaz is a 46 y.o. female here for annual well woman/preventative exam  Healthcare maintenance A1c, CMP, CBC, HIV today Check vitamin B-12 level as patient is taking daily supplementation Up-to-date on mammogram, Pap smear, colonoscopy, vaccinations  Hyperlipidemia Repeat lipid panel Consider statin pending ASCVD risk score  Overweight Discussed time exercise Screening A1c and lipid panel   Virginia Crews, MD MPH PGY-3,  [facility] 04/26/2016  2:20 PM

## 2016-04-26 NOTE — Patient Instructions (Signed)
Nice to see you again today. We are getting some labs and someone will call you or send you a letter with the results when they're available.  Next wellness checkup in 1 year.  Take care,   Dr. B   Things to do to keep yourself healthy  - Exercise at least 30-45 minutes a day, 3-4 days a week.  - Eat a low-fat diet with lots of fruits and vegetables, up to 7-9 servings per day.  - Seatbelts can save your life. Wear them always.  - Smoke detectors on every level of your home, check batteries every year.  - Eye Doctor - have an eye exam every 1-2 years  - Safe sex - if you may be exposed to STDs, use a condom.  - Alcohol -  If you drink, do it moderately, less than 2 drinks per day.  - [place]. Choose someone to speak for you if you are not able.  - Depression is common in our stressful world.If you're feeling down or losing interest in things you normally enjoy, please come in for a visit.  - Violence - If anyone is threatening or hurting you, please call immediately.

## 2016-04-26 NOTE — Assessment & Plan Note (Signed)
A1c, CMP, CBC, HIV today Check vitamin B-12 level as patient is taking daily supplementation Up-to-date on mammogram, Pap smear, colonoscopy, vaccinations

## 2016-04-26 NOTE — Assessment & Plan Note (Signed)
Discussed time exercise Screening A1c and lipid panel

## 2016-04-27 LAB — LIPID PANEL
Chol/HDL Ratio: 4.8 ratio — ABNORMAL HIGH (ref 0.0–4.4)
Cholesterol, Total: 289 mg/dL — ABNORMAL HIGH (ref 100–199)
HDL: 60 mg/dL (ref >39)
LDL Calculated: 163 mg/dL — ABNORMAL HIGH (ref 0–99)
Triglycerides: 328 mg/dL — ABNORMAL HIGH (ref 0–149)
VLDL Cholesterol Cal: 66 mg/dL — ABNORMAL HIGH (ref 5–40)

## 2016-04-27 LAB — CMP14+EGFR
ALT: 14 IU/L (ref 0–32)
AST: 13 IU/L (ref 0–40)
Albumin/Globulin Ratio: 1.7 (ref 1.2–2.2)
Albumin: 4.6 g/dL (ref 3.5–5.5)
Alkaline Phosphatase: 80 IU/L (ref 39–117)
BUN/Creatinine Ratio: 15 (ref 9–23)
BUN: 9 mg/dL (ref 6–24)
Bilirubin Total: 0.2 mg/dL (ref 0.0–1.2)
CO2: 21 mmol/L (ref 18–29)
Calcium: 9.5 mg/dL (ref 8.7–10.2)
Chloride: 97 mmol/L (ref 96–106)
Creatinine, Ser: 0.62 mg/dL (ref 0.57–1.00)
GFR calc Af Amer: 126 mL/min/{1.73_m2} (ref >59)
GFR calc non Af Amer: 109 mL/min/{1.73_m2} (ref >59)
Globulin, Total: 2.7 g/dL (ref 1.5–4.5)
Glucose: 105 mg/dL — ABNORMAL HIGH (ref 65–99)
Potassium: 4.9 mmol/L (ref 3.5–5.2)
Sodium: 135 mmol/L (ref 134–144)
Total Protein: 7.3 g/dL (ref 6.0–8.5)

## 2016-04-27 LAB — CBC
Hematocrit: 34.4 % (ref 34.0–46.6)
Hemoglobin: 11 g/dL — ABNORMAL LOW (ref 11.1–15.9)
MCH: 25.3 pg — ABNORMAL LOW (ref 26.6–33.0)
MCHC: 32 g/dL (ref 31.5–35.7)
MCV: 79 fL (ref 79–97)
Platelets: 419 10*3/uL — ABNORMAL HIGH (ref 150–379)
RBC: 4.34 x10E6/uL (ref 3.77–5.28)
RDW: 16 % — ABNORMAL HIGH (ref 12.3–15.4)
WBC: 9.2 10*3/uL (ref 3.4–10.8)

## 2016-04-27 LAB — HIV ANTIBODY (ROUTINE TESTING W REFLEX): HIV Screen 4th Generation wRfx: NONREACTIVE

## 2016-04-27 LAB — VITAMIN B12: Vitamin B-12: >2000 pg/mL — ABNORMAL HIGH (ref 232–1245)

## 2016-04-28 ENCOUNTER — Telehealth: Payer: Self-pay | Admitting: Family Medicine

## 2016-04-28 MED ORDER — FERROUS SULFATE 325 (65 FE) MG PO TABS: 325.0000 mg | ORAL_TABLET | Freq: Every day | ORAL | 3 refills | Status: DC

## 2016-04-28 NOTE — Telephone Encounter (Signed)
Called patient to discuss lab results. HIV negative. CBC with Hgb 11.0, platelets stable.  Patient is regularly menstruating.  Can take daily iron supplement with menses. Advised about potential constipation and stool softener.  B12 elevated >2000, stop B12 supplement.  CMP unremarkable.  Lipid panel with HLD but ASCVD 1.2% 10 yr risk. No statin at this time.  Virginia Crews, MD, MPH PGY-3,  [facility] 04/28/2016 10:58 AM

## 2016-04-29 LAB — HEMOGLOBIN A1C
Est. average glucose Bld gHb Est-mCnc: 134 mg/dL
Hgb A1c MFr Bld: 6.3 % — ABNORMAL HIGH (ref 4.8–5.6)

## 2016-04-29 LAB — SPECIMEN STATUS REPORT

## 2016-04-30 ENCOUNTER — Telehealth: Payer: Self-pay | Admitting: *Deleted

## 2016-04-30 NOTE — Telephone Encounter (Signed)
Pt informed and will call back at the end of the month to schedule f/u. Deseree Kennon Holter, CMA

## 2016-04-30 NOTE — Telephone Encounter (Signed)
-----   Message from Virginia Crews, MD sent at 04/30/2016 10:48 AM EDT ----- Patient has pre-diabetes on A1c. She should cut back on carb intake.  Schedule f/u in 3 months to recheck and discuss how diet is going.  Please let patient know.  Virginia Crews, MD, MPH PGY-3,  [facility] 04/30/2016 10:48 AM

## 2017-02-09 ENCOUNTER — Encounter: Payer: Self-pay | Admitting: Internal Medicine

## 2017-02-09 ENCOUNTER — Ambulatory Visit: Payer: BLUE CROSS/BLUE SHIELD | Admitting: Internal Medicine

## 2017-02-09 ENCOUNTER — Other Ambulatory Visit: Payer: Self-pay

## 2017-02-09 VITALS — BP 102/80 | HR 86 | Temp 98.1°F | Wt 156.4 lb

## 2017-02-09 DIAGNOSIS — H8111 Benign paroxysmal vertigo, right ear: Secondary | ICD-10-CM

## 2017-02-09 NOTE — Progress Notes (Signed)
   [facility] Kerrin Mo, MD Phone: [phone]  Reason For Visit: SDA for   #Dizziness -States that she has had about 2 weeks of intermittent dizziness.  Last a couple of minutes especially when she is moving her head or changing position sometimes when she moves her eyes.  She states that the room spins when she is having his episodes.  She does not feel lightheaded.  She does not have palpitations.  She does not feel short of breath.  Denies any nausea or vomiting.  Denies any hearing loss.  She does note having some pain her right ear today.  She denies any tinnitus.  She denies any vision changes, Slurred speech, Numbness or weakness in the face or body, incoordination, Headaches, Vomiting, Problems with her gait.  No associated chest pain.  She does indicate that she had some congestion about a week ago and this kind of resolved on its own.  She also indicates having some morning congestion over the past week.    Past Medical History Reviewed problem list.  Medications- reviewed and updated No additions to family history Social history- patient is a non-smoker  Objective: BP 102/80 (BP Location: Left Arm, Patient Position: Sitting, Cuff Size: Normal)   Pulse 86   Temp 98.1 F (36.7 C) (Oral)   Wt 156 lb 6.4 oz (70.9 kg)   LMP 01/24/2017   SpO2 99%   BMI 27.71 kg/m  Gen: NAD, alert, cooperative with exam   Head: Maxillary or ethmoid tenderness noted on examination    Eyes: Negative vertical nystagmus or significant horizontal nystagmus, Dix-Hallpike maneuver elicited vertigo    Ears: Tympanic membranes intact, normal light reflex, no erythema, no bulging    Nose: Mild nasal congestion    Throat: No postnasal drip noted Cardio: regular rate and rhythm, S1S2 heard, no murmurs appreciated Pulm: clear to auscultation bilaterally, no wheezes, rhonchi or rales Neuro:  Cn 2-7 intact Strength equal & normal in upper & lower extremities Able to walk on  heels and toes.   Balance normal   finger to nose     Assessment/Plan: See problem based a/p  Benign paroxysmal positional vertigo of right ear Vertigo most consistent with BPPV, lack of hearing loss makes this less likely to be Mnire's disease though ultimately hearing test would have to be done to rule this out completely.  Dix-Hallpike elicited vertigo making it more likely to be BPPV.  Patient without hx or exam concerning for central cause of vertigo.  - Will refer patient for vestibular rehab - Given associated ear pain which is likely due to some congestion will start patient on Zyrtec - Please follow up in 1 week if no improvement

## 2017-02-09 NOTE — Patient Instructions (Addendum)
Please follow up in 1 week if no improvement  Benign Positional Vertigo Vertigo is the feeling that you or your surroundings are moving when they are not. Benign positional vertigo is the most common form of vertigo. The cause of this condition is not serious (is benign). This condition is triggered by certain movements and positions (is positional). This condition can be dangerous if it occurs while you are doing something that could endanger you or others, such as driving. What are the causes? In many cases, the cause of this condition is not known. It may be caused by a disturbance in an area of the inner ear that helps your brain to sense movement and balance. This disturbance can be caused by a viral infection (labyrinthitis), head injury, or repetitive motion. What increases the risk? This condition is more likely to develop in:  Women.  People who are 19 years of age or older.  What are the signs or symptoms? Symptoms of this condition usually happen when you move your head or your eyes in different directions. Symptoms may start suddenly, and they usually last for less than a minute. Symptoms may include:  Loss of balance and falling.  Feeling like you are spinning or moving.  Feeling like your surroundings are spinning or moving.  Nausea and vomiting.  Blurred vision.  Dizziness.  Involuntary eye movement (nystagmus).  Symptoms can be mild and cause only slight annoyance, or they can be severe and interfere with daily life. Episodes of benign positional vertigo may return (recur) over time, and they may be triggered by certain movements. Symptoms may improve over time. How is this diagnosed? This condition is usually diagnosed by medical history and a physical exam of the head, neck, and ears. You may be referred to a health care provider who specializes in ear, nose, and throat (ENT) problems (otolaryngologist) or a provider who specializes in disorders of the nervous system  (neurologist). You may have additional testing, including:  Eye movement tests. Your health care provider may ask you to change positions quickly while he or she watches you for symptoms of benign positional vertigo, such as nystagmus. Eye movement may be tested with an electronystagmogram (ENG), caloric stimulation, the Dix-Hallpike test, or the roll test.  How is this treated? Usually, your health care provider will treat this by moving your head in specific positions to adjust your inner ear back to normal. Surgery may be needed in severe cases, but this is rare. In some cases, benign positional vertigo may resolve on its own in 2-4 weeks. Follow these instructions at home: Safety  Move slowly.Avoid sudden body or head movements.  Avoid driving.  Avoid operating heavy machinery.  Avoid doing any tasks that would be dangerous to you or others if a vertigo episode would occur.  If you have trouble walking or keeping your balance, try using a cane for stability. If you feel dizzy or unstable, sit down right away.  Return to your normal activities as told by your health care provider. Ask your health care provider what activities are safe for you. General instructions  Take over-the-counter and prescription medicines only as told by your health care provider.  Avoid certain positions or movements as told by your health care provider.  Drink enough fluid to keep your urine clear or pale yellow.  Keep all follow-up visits as told by your health care provider. This is important. Contact a health care provider if:  You have a fever.  Your condition  gets worse or you develop new symptoms.  Your family or friends notice any behavioral changes.  Your nausea or vomiting gets worse.  You have numbness or a "pins and needles" sensation. Get help right away if:  You have difficulty speaking or moving.  You are always dizzy.  You faint.  You develop severe headaches.  You have  weakness in you legs or arms.  You have changes in your hearing or vision.  You develop a stiff neck.  You develop sensitivity to light. This information is not intended to replace advice given to you by your health care provider. Make sure you discuss any questions you have with your health care provider. Document Released: 10/19/2005 Document Revised: 06/19/2015 Document Reviewed: 05/06/2014 Elsevier Interactive Patient Education  [employer].

## 2017-02-10 ENCOUNTER — Encounter: Payer: Self-pay | Admitting: Internal Medicine

## 2017-02-10 DIAGNOSIS — H8111 Benign paroxysmal vertigo, right ear: Secondary | ICD-10-CM | POA: Insufficient documentation

## 2017-02-10 NOTE — Assessment & Plan Note (Addendum)
Vertigo most consistent with BPPV, lack of hearing loss makes this less likely to be Mnire's disease though ultimately hearing test would have to be done to rule this out completely.  Dix-Hallpike elicited vertigo making it more likely to be BPPV.  Patient without hx or exam concerning for central cause of vertigo.  - Will refer patient for vestibular rehab - Given associated ear pain which is likely due to some congestion will start patient on Zyrtec - Please follow up in 1 week if no improvement

## 2017-04-05 ENCOUNTER — Other Ambulatory Visit: Payer: Self-pay | Admitting: Family Medicine

## 2017-04-05 ENCOUNTER — Other Ambulatory Visit: Payer: Self-pay | Admitting: Internal Medicine

## 2017-04-05 DIAGNOSIS — Z1231 Encounter for screening mammogram for malignant neoplasm of breast: Secondary | ICD-10-CM

## 2017-04-18 ENCOUNTER — Ambulatory Visit: Payer: BLUE CROSS/BLUE SHIELD | Admitting: Student

## 2017-04-18 ENCOUNTER — Other Ambulatory Visit: Payer: Self-pay

## 2017-04-18 ENCOUNTER — Encounter: Payer: Self-pay | Admitting: Student

## 2017-04-18 VITALS — BP 126/76 | HR 80 | Temp 98.8°F | Ht 63.0 in | Wt 154.8 lb

## 2017-04-18 DIAGNOSIS — J0100 Acute maxillary sinusitis, unspecified: Secondary | ICD-10-CM

## 2017-04-18 MED ORDER — FLUTICASONE PROPIONATE 50 MCG/ACT NA SUSP: 2.0000 | Freq: Every day | NASAL | 6 refills | Status: DC

## 2017-04-18 MED ORDER — AMOXICILLIN-POT CLAVULANATE 875-125 MG PO TABS: 1.0000 | ORAL_TABLET | Freq: Two times a day (BID) | ORAL | 0 refills | Status: DC

## 2017-04-18 NOTE — Progress Notes (Signed)
  Subjective:    Karen Diaz is a 47 y.o. old female here SDA for "sinus issue"  HPI Sinus issue: Symptoms include facial pain, greenish nasal discharge with some blood, fever to 99.9, sneezing a lot and mild sore throat.  These have been going on for 2 weeks.  Denies postnasal dripping, reflux or cough. She has been using Zyrtec which helped with sneezing.  Denies shortness of breath or chest pain.  She is here today to be checked before she takes off to the beach.  She denies smoking cigarettes.  PMH/Problem List: has Hyperlipidemia; Healthcare maintenance; Overweight; and Benign paroxysmal positional vertigo of right ear on their problem list.   has a past medical history of Gestational diabetes and Hyperlipidemia (2012).  FH:  Family History  Problem Relation Age of Onset  . Diabetes Paternal Grandfather   . Esophageal cancer Paternal Grandfather   . Cancer Father        colon/breast  . Esophageal cancer Father   . Colon cancer Paternal Uncle   . Diabetes Maternal Grandmother   . Heart failure Maternal Grandmother   . Hypertension Maternal Grandmother   . Diabetes Maternal Grandfather   . Hypertension Maternal Grandfather   . Diabetes Paternal Grandmother   . Asthma Mother   . Stomach cancer Neg Hx   . Rectal cancer Neg Hx     SH Social History   Tobacco Use  . Smoking status: Former Research scientist (life sciences)  . Smokeless tobacco: Never Used  Substance Use Topics  . Alcohol use: Yes    Alcohol/week: 0.0 oz    Comment: Socially  . Drug use: No    Review of Systems Review of systems negative except for pertinent positives and negatives in history of present illness above.     Objective:     Vitals:   04/18/17 1448  BP: 126/76  Pulse: 80  Temp: 98.8 F (37.1 C)  TempSrc: Oral  SpO2: 99%  Weight: 154 lb 12.8 oz (70.2 kg)  Height: 5\' 3"  (1.6 m)   Body mass index is 27.42 kg/m.  Physical Exam  GEN: appears well. No apparent distress. Head: normocephalic and atraumatic.  Mild  facial tenderness with percussion over frontal and maxillary sinuses. Eyes: conjunctiva without injection. Sclera anicteric. PERRLA. EOMI Nares: Significant for some erythema, dry mucosa Oropharynx: MMM. No erythema. No exudation or petechiae.  Uvula midline HEM: negative for cervical or periauricular lymphadenopathies CVS: RRR, nl s1 & s2, no murmurs, no edema RESP: no IWOB, good air movement bilaterally, CTAB MSK: no focal tenderness or notable swelling SKIN: no apparent skin lesion NEURO: alert and oiented appropriately, no gross deficits   PSYCH: euthymic mood with congruent affect Assessment and Plan:  1. Acute maxillary sinusitis, recurrence not specified: given duration of symptoms,  facial tenderness with percussion and slightly elevated temperature, will treat with Augmentin for 10 days.  Discussed about the risk and benefit of this.  I also suggested trying Flonase nasal spray for possible underlying allergic rhinitis.  Gave a prescription for Augmentin and Flonase. - amoxicillin-clavulanate (AUGMENTIN) 875-125 MG tablet; Take 1 tablet by mouth 2 (two) times daily.  Dispense: 20 tablet; Refill: 0 - fluticasone (FLONASE) 50 MCG/ACT nasal spray; Place 2 sprays into both nostrils daily.  Dispense: 16 g; Refill: 6   Return if symptoms worsen or fail to improve.  Mercy Riding, MD 04/18/17 Pager: [phone]

## 2017-04-18 NOTE — Patient Instructions (Signed)
It was great seeing you today! We have addressed the following issues today  Sinus issue: this could be due to infection or allergy.  We gave you a prescription for antibiotic.  We also gave you a prescription for Flonase nasal spray for possible allergy.  You the Flonase as we discussed the office.  You may take probiotic to minimize the risk of diarrhea with the antibiotic.  Try Flonase nasal spray 4-5 times a day as we discussed as well.  If we did any lab work today, and the results require attention, either me or my nurse will get in touch with you. If everything is normal, you will get a letter in mail and a message via . If you don't hear from [place] in two weeks, please give [place] a call. Otherwise, we look forward to seeing you again at your next visit. If you have any questions or concerns before then, please call the clinic at [phone].  Please bring all your medications to every doctors visit  Sign up for My Chart to have easy access to your labs results, and communication with your Primary care physician.    Please check-out at the front desk before leaving the clinic.    Take Care,   Dr. Cyndia Skeeters

## 2017-04-27 ENCOUNTER — Other Ambulatory Visit (HOSPITAL_COMMUNITY)
Admission: RE | Admit: 2017-04-27 | Discharge: 2017-04-27 | Disposition: A | Discharge status: Home / Self Care | Payer: BLUE CROSS/BLUE SHIELD | Source: Ambulatory Visit | Attending: Obstetrics and Gynecology | Admitting: Obstetrics and Gynecology

## 2017-04-27 ENCOUNTER — Other Ambulatory Visit: Payer: Self-pay | Admitting: Obstetrics and Gynecology

## 2017-04-27 DIAGNOSIS — Z124 Encounter for screening for malignant neoplasm of cervix: Secondary | ICD-10-CM | POA: Insufficient documentation

## 2017-04-28 ENCOUNTER — Ambulatory Visit
Admission: RE | Admit: 2017-04-28 | Discharge: 2017-04-28 | Disposition: A | Discharge status: Home / Self Care | Payer: BLUE CROSS/BLUE SHIELD | Source: Ambulatory Visit | Attending: *Deleted | Admitting: *Deleted

## 2017-04-28 DIAGNOSIS — Z1231 Encounter for screening mammogram for malignant neoplasm of breast: Secondary | ICD-10-CM

## 2017-04-28 LAB — CYTOLOGY - PAP
Diagnosis: NEGATIVE
HPV: NOT DETECTED

## 2017-05-02 ENCOUNTER — Encounter: Payer: Self-pay | Admitting: Internal Medicine

## 2017-05-02 ENCOUNTER — Ambulatory Visit (INDEPENDENT_AMBULATORY_CARE_PROVIDER_SITE_OTHER): Payer: BLUE CROSS/BLUE SHIELD | Admitting: Internal Medicine

## 2017-05-02 VITALS — BP 122/78 | HR 81 | Temp 98.4°F | Wt 154.8 lb

## 2017-05-02 DIAGNOSIS — R7303 Prediabetes: Secondary | ICD-10-CM

## 2017-05-02 DIAGNOSIS — D509 Iron deficiency anemia, unspecified: Secondary | ICD-10-CM

## 2017-05-02 DIAGNOSIS — E663 Overweight: Secondary | ICD-10-CM | POA: Diagnosis not present

## 2017-05-02 DIAGNOSIS — Z0000 Encounter for general adult medical examination without abnormal findings: Secondary | ICD-10-CM

## 2017-05-02 DIAGNOSIS — E782 Mixed hyperlipidemia: Secondary | ICD-10-CM | POA: Diagnosis not present

## 2017-05-02 DIAGNOSIS — R739 Hyperglycemia, unspecified: Secondary | ICD-10-CM

## 2017-05-02 HISTORY — DX: Iron deficiency anemia, unspecified: D50.9

## 2017-05-02 LAB — POCT GLYCOSYLATED HEMOGLOBIN (HGB A1C): Hemoglobin A1C: 6.1

## 2017-05-02 MED ORDER — METFORMIN HCL 500 MG PO TABS: 500.0000 mg | ORAL_TABLET | Freq: Two times a day (BID) | ORAL | 1 refills | Status: DC

## 2017-05-02 NOTE — Patient Instructions (Signed)
It was nice meeting you today Ms. Tanksley!  Please begin taking metformin. Start by taking one tablet (500 mg) a day with a meal for the first week. After that, increase to one tablet twice a day with meals. If you have stomach issues, continue taking the medication for a couple more weeks, and this will likely resolve. If it does not, call me to let me know, and we can discontinue the medication.   I will call you when your lab results have returned. We can discuss starting any additional medications then if necessary.   If you have any questions or concerns, please feel free to call the clinic.   Be well,  Dr. Avon Gully

## 2017-05-02 NOTE — Assessment & Plan Note (Signed)
CBC today. If microcytic anemia, would recommend taking iron supplementation daily rather than only when menstruating.

## 2017-05-02 NOTE — Assessment & Plan Note (Signed)
A1C 6.3 last year, 6.1 this year. Patient very concerned about developed diabetes, as she has history of gestational DM and was told she would likely develop Type II DM by age 47. Is interested in beginning metformin today. Discussed possible GI side effects. Patient to call if side effects persist, and would discontinue at that time.

## 2017-05-02 NOTE — Assessment & Plan Note (Signed)
Starting a new weight loss initiative with her son, with plans to improve diet and begin exercising on new treadmill she bought. Hopefully beginning metformin today will also aid in weight loss. Continue to monitor at subsequent visits.

## 2017-05-02 NOTE — Assessment & Plan Note (Addendum)
Not currently taking any medication. Myalgias with statins in 2014. Will recheck lipid panel today. Could consider fenofibrate or gemfibrozil is remains elevated.

## 2017-05-02 NOTE — Progress Notes (Signed)
47 y.o. year old female presents for well woman/preventative visit.  Acute Concerns: "Changes"  Patient says that she has noticed a lot of body changes recently, and thinks she may be beginning menopause soon. Reports weight gain that is very frustrating to her. Also endorses difficulty with sleep, as well as night sweats. Finally reports generalized irritability. Was seen by her gynecologist last week, and TSH was checked given weight gain, with no abnormalities noted. Still having regular monthly periods. LMP 03/24.   Anemia History of iron-deficiency anemia. Prescribed iron supplements but was told she only needs to take these when menstruating.   HLD Not currently on medication for this. Was prescribed statin a few years ago (atorvastatin), however said it made her muscles ache and she generally felt unwell while taking. It was subsequently stopped by her PCP at the time and no additional medications were started.   Diet: Says she recently has not been eating healthy, and has been eating "whatever I want." Stopped eating healthy because she was trying to lose weight and did not see any changes after improving her diet. She and her son are starting a weight loss plan soon, and she is planning to make changes. Has already cut down on eating fried foods, and is eating primarily grilled or baked meats. Does not drink soda but drinks a lot of sweet tea.   Exercise: Not currently exercising, however recently bought a treadmill which she is planning to start using as part of the exercise initiative with her son (see above).   Sexual/Birth History: Sexually active. Tubal ligation.    Surgical History: Past Surgical History:  Procedure Laterality Date  . APPENDECTOMY  1978  . CESAREAN SECTION  1993, 2000  . TUBAL LIGATION  2000    Allergies: Allergies  Allergen Reactions  . Flagyl [Metronidazole] Hives and Anxiety    Social:  Social History   Socioeconomic History  . Marital  status: Married    Spouse name: Not on file  . Number of children: Not on file  . Years of education: Not on file  . Highest education level: Not on file  Occupational History  . Not on file  Social Needs  . Financial resource strain: Not on file  . Food insecurity:    Worry: Not on file    Inability: Not on file  . Transportation needs:    Medical: Not on file    Non-medical: Not on file  Tobacco Use  . Smoking status: Former Research scientist (life sciences)  . Smokeless tobacco: Never Used  Substance and Sexual Activity  . Alcohol use: Yes    Alcohol/week: 0.0 oz    Comment: Socially  . Drug use: No  . Sexual activity: Not on file  Lifestyle  . Physical activity:    Days per week: Not on file    Minutes per session: Not on file  . Stress: Not on file  Relationships  . Social connections:    Talks on phone: Not on file    Gets together: Not on file    Attends religious service: Not on file    Active member of club or organization: Not on file    Attends meetings of clubs or organizations: Not on file    Relationship status: Not on file  Other Topics Concern  . Not on file  Social History Narrative  . Not on file    Immunization: Immunization History  Administered Date(s) Administered  . Influenza,inj,Quad PF,6+ Mos 11/20/2012, 12/17/2013  . Influenza-Unspecified  11/25/2016  . Tdap 02/19/2012    Cancer Screening:  Pap Smear: 04/27/17 - no abnormalities  Mammogram: 04/28/17  Colonoscopy: Last at age 15 - no abnormalities  Physical Exam: VITALS: Reviewed GEN: Pleasant female, NAD HEENT: Normocephalic, PERRL, EOMI, no scleral icterus, bilateral TM pearly grey, nasal septum midline, MMM, uvula midline, no anterior or posterior lymphadenopathy, no thyromegaly CARDIAC:RRR, S1 and S2 present, no murmur, no heaves/thrills RESP: CTAB, normal effort ABD: Soft, no tenderness, normal bowel sounds EXT: No edema, 2+ radial and DP pulses SKIN: Warm and dry, no rash PSYCH: Appropriate mood and  affect NEURO: A&Ox3  ASSESSMENT & PLAN: 47 y.o. female presents for annual well woman/preventative exam and GYN exam. Please see problem specific assessment and plan.   Hyperlipidemia Not currently taking any medication. Myalgias with statins in 2014. Will recheck lipid panel today. Could consider fenofibrate or gemfibrozil is remains elevated.   Overweight Starting a new weight loss initiative with her son, with plans to improve diet and begin exercising on new treadmill she bought. Hopefully beginning metformin today will also aid in weight loss. Continue to monitor at subsequent visits.   Pre-diabetes A1C 6.3 last year, 6.1 this year. Patient very concerned about developed diabetes, as she has history of gestational DM and was told she would likely develop Type II DM by age 64. Is interested in beginning metformin today. Discussed possible GI side effects. Patient to call if side effects persist, and would discontinue at that time.   Iron deficiency anemia CBC today. If microcytic anemia, would recommend taking iron supplementation daily rather than only when menstruating.    Karen Hector, MD, MPH PGY-3 [facility] Pager [phone]

## 2017-05-03 LAB — CBC
Hematocrit: 35.8 % (ref 34.0–46.6)
Hemoglobin: 11.6 g/dL (ref 11.1–15.9)
MCH: 26.7 pg (ref 26.6–33.0)
MCHC: 32.4 g/dL (ref 31.5–35.7)
MCV: 83 fL (ref 79–97)
Platelets: 428 10*3/uL — ABNORMAL HIGH (ref 150–379)
RBC: 4.34 x10E6/uL (ref 3.77–5.28)
RDW: 15.5 % — ABNORMAL HIGH (ref 12.3–15.4)
WBC: 9.2 10*3/uL (ref 3.4–10.8)

## 2017-05-03 LAB — BASIC METABOLIC PANEL
BUN/Creatinine Ratio: 11 (ref 9–23)
BUN: 8 mg/dL (ref 6–24)
CO2: 20 mmol/L (ref 20–29)
Calcium: 9.5 mg/dL (ref 8.7–10.2)
Chloride: 99 mmol/L (ref 96–106)
Creatinine, Ser: 0.72 mg/dL (ref 0.57–1.00)
GFR calc Af Amer: 116 mL/min/{1.73_m2} (ref >59)
GFR calc non Af Amer: 101 mL/min/{1.73_m2} (ref >59)
Glucose: 121 mg/dL — ABNORMAL HIGH (ref 65–99)
Potassium: 5 mmol/L (ref 3.5–5.2)
Sodium: 137 mmol/L (ref 134–144)

## 2017-05-03 LAB — LIPID PANEL
Chol/HDL Ratio: 5.6 ratio — ABNORMAL HIGH (ref 0.0–4.4)
Cholesterol, Total: 282 mg/dL — ABNORMAL HIGH (ref 100–199)
HDL: 50 mg/dL (ref >39)
Triglycerides: 438 mg/dL — ABNORMAL HIGH (ref 0–149)

## 2017-05-12 ENCOUNTER — Telehealth: Payer: Self-pay | Admitting: Internal Medicine

## 2017-05-12 MED ORDER — ROSUVASTATIN CALCIUM 10 MG PO TABS: 10.0000 mg | ORAL_TABLET | Freq: Every day | ORAL | 3 refills | Status: DC

## 2017-05-12 NOTE — Telephone Encounter (Signed)
Called patient regarding elevated cholesterol panel. Has had myalgias with atorvastatin in the past. Will try Crestor 10mg  qd, as this should have fewer side effects. If patient does begin to experience some myalgias, could decrease to three times per week. Patient amenable to this plan.   Adin Hector, MD, MPH PGY-3 [facility] Pager [phone]

## 2017-06-16 ENCOUNTER — Ambulatory Visit: Payer: BLUE CROSS/BLUE SHIELD | Admitting: Internal Medicine

## 2018-02-24 ENCOUNTER — Other Ambulatory Visit: Payer: Self-pay | Admitting: Family Medicine

## 2018-02-24 NOTE — Telephone Encounter (Signed)
See Rx request ° °

## 2018-08-15 ENCOUNTER — Other Ambulatory Visit: Payer: Self-pay

## 2018-08-15 DIAGNOSIS — Z20822 Contact with and (suspected) exposure to covid-19: Secondary | ICD-10-CM

## 2018-08-17 LAB — NOVEL CORONAVIRUS, NAA: SARS-CoV-2, NAA: NOT DETECTED

## 2019-03-22 ENCOUNTER — Other Ambulatory Visit: Payer: Self-pay | Admitting: Physician Assistant

## 2019-04-16 NOTE — Progress Notes (Signed)
Subjective:  CC -- Annual Physical; With complaints of:  Blood concerns Patient reports when she was seen at [facility] she was told she may need to see a hematologist.  Last year when her insurance changed she had to be seen at [facility] for PCP care.  At that time they obtained a CBC and stated she was very anemic.  Stated if iron tablets did not help she would need to be referred to a hematologist.  Patient previously was taking iron daily.  Has been out of this medication for some time now.  Lab work in care everywhere shows hemoglobin 12.3 in September with MCV of 87.9.  Platelets were 361.  ASA use Patient was previously put on aspirin by her PCP at [facility].  She has never had a stroke or heart attack.  Is wondering if she needs to be on this.  After she was placed on this her sister who is a nurse told her she should not take this due to her anemia.  She then stopped at this and did not want to restart until speaking to me.  Skin tags  Pt reports she has had skin tags for 2 months now.  They are on her neck and under her bra.  They snag on her clothing as well as on her hair.  They are very uncomfortable and painful.  She would like these removed.  Does see a dermatologist for skin cancer screenings.  At the dermatologist the removal of the skin tag was very expensive.  She does report having previous removals in our clinic and is wondering if her insurance will cover it.  Cardiovascular: - Risk as of 04/17/2019(date):  The ASCVD Risk score Mikey Bussing DC Jr., et al., 2013) failed to calculate for the following reasons:   The valid total cholesterol range is 130 to 320 mg/dL - Dx Hypertension: no  - Dx Hyperlipidemia: yes   - Dx Obesity: no; overweight  - Physical Activity: no; wants to start treadmill but has low energy  - Diabetes: pre-diabetes    Cancer: Colorectal >> Colonoscopy: yes, due in 2023; father had colon cancer (passed away @52 ), multiple uncles and PGF in 31s.   Lung  >> Tobacco Use: former   Breast >> Mammogram: last mammogram 1 year ago, is going to call to schedule for this year   Cervical/Endometrial >>  - Postmenopausal: no  - Vaginal Bleeding: no - Pap Smear: obtains at GYN office; last PAP 2019 wnl  - Previous Abnormal Pap: no  Skin >> Suspicious lesions: no   Social: Alcohol Use: yes  Tobacco Use: no   - Interested in Quitting: N/A  Other Drugs: no  Risky Sexual Behavior: no  Depression: no   - PHQ9 score:  Depression screen [facility] 2/9 04/17/2019 04/17/2019 04/18/2017 02/09/2017 04/26/2016  Decreased Interest 0 0 0 0 0  Down, Depressed, Hopeless 0 0 0 0 0  PHQ - 2 Score 0 0 0 0 0  Altered sleeping 3 3 - - 0  Tired, decreased energy 3 3 - - 0  Change in appetite 1 1 - - 0  Feeling bad or failure about yourself  0 0 - - 0  Trouble concentrating 0 0 - - 0  Moving slowly or fidgety/restless 0 0 - - 0  Suicidal thoughts 0 0 - - 0  PHQ-9 Score 7 7 - - 0  Difficult doing work/chores Not difficult at all Not difficult at all - - -  Support and Life at Home: yes   Other: Osteoporosis: no   Zoster Vaccine: no   Flu Vaccine: no  Pneumonia Vaccine: no     ROS-see above  Past Medical History Patient Active Problem List   Diagnosis Date Noted  . Skin tag 04/17/2019  . Thrombocytosis ([place]) 04/17/2019  . Pre-diabetes 05/02/2017  . Iron deficiency anemia 05/02/2017  . Benign paroxysmal positional vertigo of right ear 02/10/2017  . Overweight 11/30/2012  . Hyperlipidemia 02/01/2011  . Healthcare maintenance 02/01/2011    Medications- reviewed and updated Current Outpatient Medications  Medication Sig Dispense Refill  . Biotin (BIOTIN 5000) 5 MG CAPS Take 1 capsule by mouth daily.    . ferrous sulfate 325 (65 FE) MG tablet TAKE 1 TABLET BY MOUTH ONCE DAILY WITH BREAKFAST 30 tablet 0  . rosuvastatin (CRESTOR) 10 MG tablet Take 1 tablet (10 mg total) by mouth daily. 90 tablet 3  . vitamin C (ASCORBIC ACID) 500 MG tablet Take 500 mg by mouth  daily.    . metFORMIN (GLUCOPHAGE-XR) 500 MG 24 hr tablet Take 1 tablet (500 mg total) by mouth daily with breakfast. 60 tablet 2   No current facility-administered medications for this visit.    Objective: BP 126/75   Pulse 78   Wt 163 lb (73.9 kg)   LMP 04/02/2019   SpO2 99%   BMI 28.87 kg/m  Gen: NAD, alert, cooperative with exam HEENT: NCAT, EOMI, PERRL CV: RRR, good S1/S2, no murmur Resp: CTABL, no wheezes, non-labored Abd: Soft, Non Tender, Non Distended, BS present, no guarding or organomegaly Genital Exam: not done Ext: No edema, warm Neuro: Alert and oriented, No gross deficits   Assessment/Plan:  Pre-diabetes Will obtain A1c.  Patient does not take Metformin daily as it causes nausea.  We discussed switching to Exar formula.  She was agreeable with this.  We will switch to Metformin 5 oh milligrams XR daily.  Hopefully with this she will be more compliant.  Advised daily exercise and diet.  Discussed possibly seeing nutritionist for prediabetes.  She said she will consider this and call me if she desires this.  Overweight Discussed healthy weight loss options such as improving diet and daily exercise.  She was agreeable to this.  Discussed possibility of referral to nutrition given that she is prediabetic.  She will think about this and call me if she requires a referral.  Metformin will likely aid in some weight loss as well.  Follow-up if desires assistance with weight loss.  Iron deficiency anemia Patient was previously told to take iron supplements and that she may need hematology referral.  I am unable to see records of anemia had her previous PCP.  I will obtain CBC and anemia panel today to ensure she is iron deficient.  Hyperlipidemia We will obtain lipid panel today.  Currently on rosuvastatin.  We discussed aspirin use for primary prevention.  Per USPSTF grade B recommendations, adult age 73-59 with a 10% 10-year CVD risk can consider starting aspirin for  primary prevention of cardiovascular disease and colorectal cancer.  She does not meet this age group.  We discussed looking at her lipid panel before considering restarting.  Skin tag Patient with multiple skin tags at the base of her neck as well as under her breasts.  They are causing pain.  We discussed removal.  Patient will check with her insurance and if her insurance covers this plans to schedule with dermatology clinic in our office.  Thrombocytosis ([place])  Per chart review patient seems to have thrombocytosis.  Unclear etiology.  Will obtain repeat CBC today to ensure improvement.  Can consider referral if worsening symptoms or labs.  Patient is currently asymptomatic.  Healthcare maintenance -Patient due for Hep c screening. Will check with her insurance to ensure coverage and then will schedule appointment for lab draw.  -Refused flu vaccine   Orders Placed This Encounter  Procedures  . Lipid Panel  . CBC  . Anemia panel  . HgB A1c    Meds ordered this encounter  Medications  . metFORMIN (GLUCOPHAGE-XR) 500 MG 24 hr tablet    Sig: Take 1 tablet (500 mg total) by mouth daily with breakfast.    Dispense:  60 tablet    Refill:  2     Caroline More, DO, PGY-3 04/17/2019 11:09 AM

## 2019-04-17 ENCOUNTER — Ambulatory Visit (INDEPENDENT_AMBULATORY_CARE_PROVIDER_SITE_OTHER): Payer: 59 | Admitting: Family Medicine

## 2019-04-17 ENCOUNTER — Encounter: Payer: Self-pay | Admitting: Family Medicine

## 2019-04-17 ENCOUNTER — Other Ambulatory Visit: Payer: Self-pay

## 2019-04-17 VITALS — BP 126/75 | HR 78 | Wt 163.0 lb

## 2019-04-17 DIAGNOSIS — R7303 Prediabetes: Secondary | ICD-10-CM | POA: Diagnosis not present

## 2019-04-17 DIAGNOSIS — Z1159 Encounter for screening for other viral diseases: Secondary | ICD-10-CM | POA: Diagnosis not present

## 2019-04-17 DIAGNOSIS — E663 Overweight: Secondary | ICD-10-CM | POA: Diagnosis not present

## 2019-04-17 DIAGNOSIS — D75839 Thrombocytosis, unspecified: Secondary | ICD-10-CM | POA: Insufficient documentation

## 2019-04-17 DIAGNOSIS — D473 Essential (hemorrhagic) thrombocythemia: Secondary | ICD-10-CM

## 2019-04-17 DIAGNOSIS — D509 Iron deficiency anemia, unspecified: Secondary | ICD-10-CM

## 2019-04-17 DIAGNOSIS — E785 Hyperlipidemia, unspecified: Secondary | ICD-10-CM

## 2019-04-17 DIAGNOSIS — D649 Anemia, unspecified: Secondary | ICD-10-CM

## 2019-04-17 DIAGNOSIS — Z0000 Encounter for general adult medical examination without abnormal findings: Secondary | ICD-10-CM

## 2019-04-17 DIAGNOSIS — L918 Other hypertrophic disorders of the skin: Secondary | ICD-10-CM | POA: Insufficient documentation

## 2019-04-17 LAB — POCT GLYCOSYLATED HEMOGLOBIN (HGB A1C): HbA1c, POC (controlled diabetic range): 6.4 % (ref 0.0–7.0)

## 2019-04-17 MED ORDER — METFORMIN HCL ER 500 MG PO TB24: 500.0000 mg | ORAL_TABLET | Freq: Every day | ORAL | 2 refills | Status: AC

## 2019-04-17 NOTE — Assessment & Plan Note (Addendum)
Will obtain A1c.  Patient does not take Metformin daily as it causes nausea.  We discussed switching to Exar formula.  She was agreeable with this.  We will switch to Metformin 5 oh milligrams XR daily.  Hopefully with this she will be more compliant.  Advised daily exercise and diet.  Discussed possibly seeing nutritionist for prediabetes.  She said she will consider this and call me if she desires this.

## 2019-04-17 NOTE — Assessment & Plan Note (Signed)
We will obtain lipid panel today.  Currently on rosuvastatin.  We discussed aspirin use for primary prevention.  Per USPSTF grade B recommendations, adult age 48-59 with a 10% 10-year CVD risk can consider starting aspirin for primary prevention of cardiovascular disease and colorectal cancer.  She does not meet this age group.  We discussed looking at her lipid panel before considering restarting.

## 2019-04-17 NOTE — Assessment & Plan Note (Signed)
-  Patient due for Hep c screening. Will check with her insurance to ensure coverage and then will schedule appointment for lab draw.  -Refused flu vaccine

## 2019-04-17 NOTE — Assessment & Plan Note (Signed)
Discussed healthy weight loss options such as improving diet and daily exercise.  She was agreeable to this.  Discussed possibility of referral to nutrition given that she is prediabetic.  She will think about this and call me if she requires a referral.  Metformin will likely aid in some weight loss as well.  Follow-up if desires assistance with weight loss.

## 2019-04-17 NOTE — Patient Instructions (Signed)
Exercising to Stay Healthy To become healthy and stay healthy, it is recommended that you do moderate-intensity and vigorous-intensity exercise. You can tell that you are exercising at a moderate intensity if your heart starts beating faster and you start breathing faster but can still hold a conversation. You can tell that you are exercising at a vigorous intensity if you are breathing much harder and faster and cannot hold a conversation while exercising. Exercising regularly is important. It has many health benefits, such as:  Improving overall fitness, flexibility, and endurance.  Increasing bone density.  Helping with weight control.  Decreasing body fat.  Increasing muscle strength.  Reducing stress and tension.  Improving overall health. How often should I exercise? Choose an activity that you enjoy, and set realistic goals. Your health care provider can help you make an activity plan that works for you. Exercise regularly as told by your health care provider. This may include:  Doing strength training two times a week, such as: ? Lifting weights. ? Using resistance bands. ? Push-ups. ? Sit-ups. ? Yoga.  Doing a certain intensity of exercise for a given amount of time. Choose from these options: ? A total of 150 minutes of moderate-intensity exercise every week. ? A total of 75 minutes of vigorous-intensity exercise every week. ? A mix of moderate-intensity and vigorous-intensity exercise every week. Children, pregnant women, people who have not exercised regularly, people who are overweight, and older adults may need to talk with a health care provider about what activities are safe to do. If you have a medical condition, be sure to talk with your health care provider before you start a new exercise program. What are some exercise ideas? Moderate-intensity exercise ideas include:  Walking 1 mile (1.6 km) in about 15  minutes.  Biking.  Hiking.  Golfing.  Dancing.  Water aerobics. Vigorous-intensity exercise ideas include:  Walking 4.5 miles (7.2 km) or more in about 1 hour.  Jogging or running 5 miles (8 km) in about 1 hour.  Biking 10 miles (16.1 km) or more in about 1 hour.  Lap swimming.  Roller-skating or in-line skating.  Cross-country skiing.  Vigorous competitive sports, such as football, basketball, and soccer.  Jumping rope.  Aerobic dancing. What are some everyday activities that can help me to get exercise?  Yard work, such as: ? Pushing a lawn mower. ? Raking and bagging leaves.  Washing your car.  Pushing a stroller.  Shoveling snow.  Gardening.  Washing windows or floors. How can I be more active in my day-to-day activities?  Use stairs instead of an elevator.  Take a walk during your lunch break.  If you drive, park your car farther away from your work or school.  If you take public transportation, get off one stop early and walk the rest of the way.  Stand up or walk around during all of your indoor phone calls.  Get up, stretch, and walk around every 30 minutes throughout the day.  Enjoy exercise with a friend. Support to continue exercising will help you keep a regular routine of activity. What guidelines can I follow while exercising?  Before you start a new exercise program, talk with your health care provider.  Do not exercise so much that you hurt yourself, feel dizzy, or get very short of breath.  Wear comfortable clothes and wear shoes with good support.  Drink plenty of water while you exercise to prevent dehydration or heat stroke.  Work out until your breathing   and your heartbeat get faster. Where to find more information  [place] Department of Health and Human Services: www.hhs.gov  Centers for Disease Control and Prevention (CDC): www.cdc.gov Summary  Exercising regularly is important. It will improve your overall fitness,  flexibility, and endurance.  Regular exercise also will improve your overall health. It can help you control your weight, reduce stress, and improve your bone density.  Do not exercise so much that you hurt yourself, feel dizzy, or get very short of breath.  Before you start a new exercise program, talk with your health care provider. This information is not intended to replace advice given to you by your health care provider. Make sure you discuss any questions you have with your health care provider. Document Revised: 12/24/2016 Document Reviewed: 12/02/2016 Elsevier Patient Education  2020 Elsevier Inc.  

## 2019-04-17 NOTE — Assessment & Plan Note (Signed)
Patient was previously told to take iron supplements and that she may need hematology referral.  I am unable to see records of anemia had her previous PCP.  I will obtain CBC and anemia panel today to ensure she is iron deficient.

## 2019-04-17 NOTE — Assessment & Plan Note (Signed)
Patient with multiple skin tags at the base of her neck as well as under her breasts.  They are causing pain.  We discussed removal.  Patient will check with her insurance and if her insurance covers this plans to schedule with dermatology clinic in our office.

## 2019-04-17 NOTE — Assessment & Plan Note (Signed)
Per chart review patient seems to have thrombocytosis.  Unclear etiology.  Will obtain repeat CBC today to ensure improvement.  Can consider referral if worsening symptoms or labs.  Patient is currently asymptomatic.

## 2019-04-18 LAB — CBC
Hemoglobin: 12.8 g/dL (ref 11.1–15.9)
MCH: 30.8 pg (ref 26.6–33.0)
MCHC: 34.5 g/dL (ref 31.5–35.7)
MCV: 89 fL (ref 79–97)
Platelets: 345 10*3/uL (ref 150–450)
RBC: 4.16 x10E6/uL (ref 3.77–5.28)
RDW: 13.1 % (ref 11.7–15.4)
WBC: 6.2 10*3/uL (ref 3.4–10.8)

## 2019-04-18 LAB — ANEMIA PANEL
Ferritin: 158 ng/mL — ABNORMAL HIGH (ref 15–150)
Folate, Hemolysate: 333 ng/mL
Folate, RBC: 898 ng/mL (ref >498)
Hematocrit: 37.1 % (ref 34.0–46.6)
Iron Saturation: 17 % (ref 15–55)
Iron: 63 ug/dL (ref 27–159)
Retic Ct Pct: 1.4 % (ref 0.6–2.6)
Total Iron Binding Capacity: 364 ug/dL (ref 250–450)
UIBC: 301 ug/dL (ref 131–425)
Vitamin B-12: 1082 pg/mL (ref 232–1245)

## 2019-04-18 LAB — LIPID PANEL
Chol/HDL Ratio: 4.2 ratio (ref 0.0–4.4)
Cholesterol, Total: 232 mg/dL — ABNORMAL HIGH (ref 100–199)
HDL: 55 mg/dL (ref >39)
LDL Chol Calc (NIH): 118 mg/dL — ABNORMAL HIGH (ref 0–99)
Triglycerides: 340 mg/dL — ABNORMAL HIGH (ref 0–149)
VLDL Cholesterol Cal: 59 mg/dL — ABNORMAL HIGH (ref 5–40)

## 2019-08-02 ENCOUNTER — Other Ambulatory Visit: Payer: Self-pay | Admitting: Family Medicine

## 2019-08-02 DIAGNOSIS — Z1231 Encounter for screening mammogram for malignant neoplasm of breast: Secondary | ICD-10-CM

## 2019-08-09 ENCOUNTER — Ambulatory Visit
Admission: RE | Admit: 2019-08-09 | Discharge: 2019-08-09 | Disposition: A | Discharge status: Home / Self Care | Payer: 59 | Source: Ambulatory Visit | Attending: Family Medicine | Admitting: Family Medicine

## 2019-08-09 ENCOUNTER — Other Ambulatory Visit: Payer: Self-pay

## 2019-08-09 DIAGNOSIS — Z1231 Encounter for screening mammogram for malignant neoplasm of breast: Secondary | ICD-10-CM

## 2019-08-14 ENCOUNTER — Other Ambulatory Visit: Payer: Self-pay | Admitting: Family Medicine

## 2019-08-14 DIAGNOSIS — R928 Other abnormal and inconclusive findings on diagnostic imaging of breast: Secondary | ICD-10-CM

## 2019-08-17 ENCOUNTER — Ambulatory Visit
Admission: RE | Admit: 2019-08-17 | Discharge: 2019-08-17 | Disposition: A | Discharge status: Home / Self Care | Payer: 59 | Source: Ambulatory Visit | Attending: Family Medicine | Admitting: Family Medicine

## 2019-08-17 ENCOUNTER — Ambulatory Visit: Payer: 59

## 2019-08-17 DIAGNOSIS — R928 Other abnormal and inconclusive findings on diagnostic imaging of breast: Secondary | ICD-10-CM

## 2019-10-02 ENCOUNTER — Encounter: Payer: Self-pay | Admitting: Family Medicine

## 2019-10-02 ENCOUNTER — Ambulatory Visit (INDEPENDENT_AMBULATORY_CARE_PROVIDER_SITE_OTHER): Payer: 59 | Admitting: Family Medicine

## 2019-10-02 ENCOUNTER — Other Ambulatory Visit: Payer: Self-pay

## 2019-10-02 VITALS — BP 116/80 | HR 87 | Ht 63.0 in | Wt 161.8 lb

## 2019-10-02 DIAGNOSIS — E782 Mixed hyperlipidemia: Secondary | ICD-10-CM | POA: Diagnosis not present

## 2019-10-02 DIAGNOSIS — R7303 Prediabetes: Secondary | ICD-10-CM | POA: Diagnosis not present

## 2019-10-02 LAB — POCT GLYCOSYLATED HEMOGLOBIN (HGB A1C): HbA1c, POC (prediabetic range): 6.2 % (ref 5.7–6.4)

## 2019-10-02 MED ORDER — ROSUVASTATIN CALCIUM 20 MG PO TABS: 20.0000 mg | ORAL_TABLET | Freq: Every day | ORAL | 3 refills | Status: AC

## 2019-10-02 MED ORDER — ROSUVASTATIN CALCIUM 10 MG PO TABS: ORAL_TABLET | ORAL | 0 refills | Status: DC

## 2019-10-02 NOTE — Patient Instructions (Signed)
Please take 1 tablet of crestor daily for two weeks.  After that you can take 2 tablets a day of the 10mg  pills for two more weeks until your prescription runs out.  After that I will prescribe 20mg  tablets.  You will take one of these tablets a day.    I will let you know the results of the lab test results when I get them.    I have referred you to the healthy weight and wellness clinic for help with management of you prediabetes.     Have a great day,   Clemetine Marker, MD

## 2019-10-02 NOTE — Progress Notes (Signed)
    SUBJECTIVE:   CHIEF COMPLAINT / HPI:   Prediabetes: pt felt nauseous/weak with metformin. When she takes it for at least a week she feels a little better but still 'blah'. She hasn't been taking it lately. Is interested in a referral for weight loss help.   HLD: pt has not taken her crestor in a month due to running out of her medication.  When she takes her statin she has muscle aches for about 1-2 weeks before symptoms resolve.     PERTINENT  PMH / PSH: HLD, prediabetes  OBJECTIVE:   BP 116/80   Pulse 87   Ht 5\' 3"  (1.6 m)   Wt 161 lb 12.8 oz (73.4 kg)   LMP 09/18/2019 (Exact Date)   SpO2 98%   BMI 28.66 kg/m   Gen: alert.  Oriented.  No acute distress.  CV: RRR. No murmurs Pulmonary: LCTAB. Wheezes.   ASSESSMENT/PLAN:   Hyperlipidemia Hasn't been taking statin in about a month.  Will restart at low dose for two weeks, then increase to 20mg  after that.  willget direct LDL today.    Pre-diabetes Not taking metformin. Discussed with pt lack of proven benefit of metformin in prediabetes. Will recheck A1c and refer to healthy weight and wellness clinic.  Dr. Jenne Campus information also provided.      Benay Pike, MD [place]

## 2019-10-03 LAB — LDL CHOLESTEROL, DIRECT: LDL Direct: 233 mg/dL — ABNORMAL HIGH (ref 0–99)

## 2019-10-03 NOTE — Assessment & Plan Note (Signed)
Not taking metformin. Discussed with pt lack of proven benefit of metformin in prediabetes. Will recheck A1c and refer to healthy weight and wellness clinic.  Dr. Jenne Campus information also provided.

## 2019-10-03 NOTE — Assessment & Plan Note (Signed)
Hasn't been taking statin in about a month.  Will restart at low dose for two weeks, then increase to 20mg  after that.  willget direct LDL today.

## 2019-10-31 ENCOUNTER — Other Ambulatory Visit: Payer: Self-pay | Admitting: Family Medicine

## 2019-10-31 DIAGNOSIS — R7303 Prediabetes: Secondary | ICD-10-CM

## 2019-10-31 NOTE — Progress Notes (Signed)
Patient has an initial nutrition education appointment with Dr. Jenne Campus tomorrow (11/01/2019). Prior referral was expired per Dr. Jenne Campus records, therefore I have placed a new referral.

## 2019-11-01 ENCOUNTER — Ambulatory Visit (INDEPENDENT_AMBULATORY_CARE_PROVIDER_SITE_OTHER): Payer: 59 | Admitting: Family Medicine

## 2019-11-01 ENCOUNTER — Other Ambulatory Visit: Payer: Self-pay

## 2019-11-01 DIAGNOSIS — Z713 Dietary counseling and surveillance: Secondary | ICD-10-CM | POA: Diagnosis not present

## 2019-11-01 DIAGNOSIS — E782 Mixed hyperlipidemia: Secondary | ICD-10-CM

## 2019-11-01 DIAGNOSIS — R7303 Prediabetes: Secondary | ICD-10-CM

## 2019-11-01 NOTE — Progress Notes (Signed)
Telehealth Encounter PCP Alcus Dad, MD  I connected with Karen Diaz (MRN [id number]) on 11/01/2019 by MyChart video-enabled, HIPAA-compliant telemedicine application, verified that I was speaking with the correct person using two identifiers, and that the patient was in a private environment conducive to confidentiality.  The patient agreed to proceed.  Provider was Kennith Center, PhD, RD, LDN, CEDRD Provider was located at [facility] during this telehealth encounter; patient was at home  Appt start time: 1000 end time: 1100 (75 minutes)  Reason for telehealth visit: Referred by Clemetine Marker, MD for Medical Nutrition Therapy related to pre-DM and HLD.    Relevant history/background: [name] was diagnosed with pre-diabetes ~3 yrs ago.  (Had GDM when pregnant with her son 20 yrs ago.)  A1c on 10/02/19 was 6.2.  Also has a h/o hyperlipidemia (non-fasting LDL 233 on 10/02/19) and overweight (ht 63", wt 161.8, BMI 28.66 on 10/02/19).  Started gaining weight ~age 29, and has been frustrated at inability to lose.  Assessment: [name] lives with her husband, 20-YO son, and mother.  Goes out for fast food lunch with mom 2-3 X wk.  Cooks dinner most days.   Usual eating pattern: 2 meals and 0-1 snack per day. Frequent foods and beverages: water, 12-24 oz swt tea/day; veg's, cheese, fast food for lunch 2-3 X wk.   Avoided foods: tries to avoid fried foods.   Usual physical activity: walks ~30 min with mom or on home TM 2 X wk.  Gets up to 5,000 steps about 1 X wk.   Sleep: Estimates average of 8+ hours of sleep/night.  Bedtime ~10 PM; up at 8:30 or 9 AM.   24-hr recall:  (Up at 9 AM) B ( AM)-  water Snk ( AM)-  water L (12 PM)-  [facility]: 1 stuffed crab, 3 fried  fantail shrimp, 5 1" hush puppies, 16 oz swt tea Snk (3:30)-  2 pepperoni slices, 1 tsp cream cheese, water D (7 PM)-  1 1/2 c stir-fry beef with cactus (mostly cactus), 2 tbsp pintos, 1 c rice, 1 tbsp sour cream,  1 corn tortilla, 16 oz swt tea Snk (7:20)-  1 Nutty Buddy ice cream Typical day? Yes.  Usually has dessert 1-2 X wk.     Intervention: Completed diet and exercise history, and established behavioral goals.   For recommendations and goals, see Patient Instructions.    Follow-up: 6 weeks; patient wants to make sure insurance covers MNT.   Iver Nestle

## 2019-11-01 NOTE — Patient Instructions (Addendum)
  Why you want to avoid high blood sugar: Poorly controlled blood sugar greatly increases risk for (among other problems) heart disease, loss of vision, neuropathies (pins and needles in hands and feet), and kidney disease, as well as poor healing from infection.    High-glycemic foods = foods that raise blood sugar well.  Suggestion:  Work on reducing the sugar in your tea, starting with mixing sweet with unsweetened tea, progressively increasing the unsweetened.  Remember that taste preferences are learned.    Diet Recommendations for Diabetes  Carbohydrate includes starch, sugar, and fiber.  Of these, only sugar and starch raise blood glucose.  (Fiber is found in fruits, vegetables [especially skin, seeds, and stalks], whole grains, and beans.)   Starchy (carb) foods: Bread, rice, pasta, potatoes, corn, cereal, grits, crackers, bagels, muffins, all baked goods.  (Fruit, milk, and yogurt also have carbohydrate, but most of these foods will not spike your blood sugar as most starchy foods will.)  A few fruits do cause high blood sugars; use small portions of bananas (limit to 1/2 at a time), grapes, watermelon, oranges, and most tropical fruits.   Protein foods: Meat, fish, poultry, eggs, dairy foods, and beans such as pinto and kidney beans (beans also provide carbohydrate).   1. Eat at least 3 REAL meals and 1-2 snacks per day. Eat breakfast within the first hour of getting up.  Have something to eat at least every 5 hours while awake.   2. Limit starchy foods to TWO per meal and ONE per snack. ONE portion of a starchy food is equal to the following:   - ONE slice of bread (or its equivalent, such as half of a hamburger bun).   - 1/2 cup of a "scoopable" starchy food such as potatoes or rice.   - 15 grams of Total Carbohydrate as shown on food label.   - Every 4 ounces of a sweet drink (including fruit juice). 3. Include at every lunch and dinner: a protein food, a carb food, and vegetables.   -  Obtain twice the volume of veg's as protein or carbohydrate foods for both lunch and dinner.   - When you get take-out, add a vegetable at home.    - Fresh or frozen vegetables are best.   - Keep frozen vegetables on hand for a quick option.     Document your progress on the above goals, e.g., on a kitchen calendar.  I will ask about how you are documenting at your follow-up appt.    Follow-up telehealth appt on Thursday, Nov 18 at 1:30 PM.

## 2019-11-19 ENCOUNTER — Other Ambulatory Visit: Payer: Self-pay

## 2019-11-19 ENCOUNTER — Encounter: Payer: Self-pay | Admitting: Family Medicine

## 2019-11-19 ENCOUNTER — Ambulatory Visit (INDEPENDENT_AMBULATORY_CARE_PROVIDER_SITE_OTHER): Payer: 59 | Admitting: Family Medicine

## 2019-11-19 VITALS — BP 126/82 | HR 78 | Wt 162.0 lb

## 2019-11-19 DIAGNOSIS — E785 Hyperlipidemia, unspecified: Secondary | ICD-10-CM | POA: Diagnosis not present

## 2019-11-19 DIAGNOSIS — Z7289 Other problems related to lifestyle: Secondary | ICD-10-CM

## 2019-11-19 DIAGNOSIS — F1090 Alcohol use, unspecified, uncomplicated: Secondary | ICD-10-CM

## 2019-11-19 DIAGNOSIS — L918 Other hypertrophic disorders of the skin: Secondary | ICD-10-CM | POA: Diagnosis not present

## 2019-11-19 DIAGNOSIS — Z1159 Encounter for screening for other viral diseases: Secondary | ICD-10-CM | POA: Diagnosis not present

## 2019-11-19 NOTE — Progress Notes (Signed)
    SUBJECTIVE:   CHIEF COMPLAINT / HPI:   Hyperlipidemia Patient presents for follow up of her hyperlipidemia. She is currently taking Rosuvastatin 20mg  daily with good compliance. Denies any side effects from the medication.   Excess alcohol consumption Patient is requesting to check her liver function, as she drinks approximately 6 beers every weekend.  Skin Tags Patient reports multiple skin tags under her breasts and a few on her neck. Would like them removed as they are bothersome and catch on her clothing/jewelry.   PERTINENT  PMH / PSH: HLD, pre-diabetes  OBJECTIVE:   BP 126/82   Pulse 78   Wt 162 lb (73.5 kg)   SpO2 98%   BMI 28.70 kg/m   Gen: alert, well-appearing, NAD CV: RRR, normal S1/S2 without m/r/g Pulm: normal WOB, lungs CTAB Abd: +BS, soft, nontender, nondistended Ext: no peripheral edema Skin: small flesh-colored, pedunculated lesions on anterior and posterior neck, and inframammary creases.  ASSESSMENT/PLAN:   Hyperlipidemia Not currently at goal. Direct LDL 233 on 10/02/2019. Patient on Rosuvastatin 20mg  daily due to LDL >190. Tolerating statin well with good med compliance. 10 year ASCVD risk 1.3% based on lipid panel from March 2021, although this does not take her elevated LDL into account. -Recheck direct LDL today -Continue Rosuvastatin 20mg  daily pending results of today's labs  Skin tags Multiple skin tags on neck and inframammary creases. Patient will schedule appointment with our derm clinic for removal.  Alcohol intake above recommended sensible limits Patient currently drinking ~6 beers most Fridays/Saturdays. Expresses interest in cutting back a little. Counseling provided regarding appropriate alcohol intake, risks of excess intake, and benefits of cutting back. Patient agreeable and will gradually work to decrease alcohol consumption to 2 drinks per setting, or no more than 7 drinks per week. -Will obtain CMP today given alcohol intake and  statin use   Alcus Dad, MD [place]

## 2019-11-19 NOTE — Patient Instructions (Signed)
It was wonderful to meet you!  Our plans for today:  - We are rechecking your cholesterol levels. I will call you with the results. - We are also checking your liver function. - As discussed, it would be beneficial for your health to decrease your alcohol intake. The maximum recommended intake for women your age is no more than 2 drinks in any one setting (and no more than 7 drinks per week total).  Take care and seek immediate care sooner if you develop any concerns.   Dr. [facility]

## 2019-11-19 NOTE — Assessment & Plan Note (Signed)
Patient currently drinking ~6 beers most Fridays/Saturdays. Expresses interest in cutting back a little. Counseling provided regarding appropriate alcohol intake, risks of excess intake, and benefits of cutting back. Patient agreeable and will gradually work to decrease alcohol consumption to 2 drinks per setting, or no more than 7 drinks per week. -Will obtain CMP today given alcohol intake and statin use

## 2019-11-19 NOTE — Assessment & Plan Note (Addendum)
Multiple skin tags on neck and inframammary creases. Patient will schedule appointment with our derm clinic for removal.

## 2019-11-19 NOTE — Assessment & Plan Note (Addendum)
Not currently at goal. Direct LDL 233 on 10/02/2019. Patient on Rosuvastatin 20mg  daily due to LDL >190. Tolerating statin well with good med compliance. 10 year ASCVD risk 1.3% based on lipid panel from March 2021, although this does not take her elevated LDL into account. -Recheck direct LDL today -Continue Rosuvastatin 20mg  daily pending results of today's labs

## 2019-11-20 LAB — COMPREHENSIVE METABOLIC PANEL
ALT: 32 IU/L (ref 0–32)
AST: 25 IU/L (ref 0–40)
Albumin/Globulin Ratio: 2 (ref 1.2–2.2)
Albumin: 4.6 g/dL (ref 3.8–4.8)
Alkaline Phosphatase: 78 IU/L (ref 44–121)
BUN/Creatinine Ratio: 12 (ref 9–23)
BUN: 8 mg/dL (ref 6–24)
Bilirubin Total: 0.3 mg/dL (ref 0.0–1.2)
CO2: 21 mmol/L (ref 20–29)
Calcium: 9.8 mg/dL (ref 8.7–10.2)
Chloride: 102 mmol/L (ref 96–106)
Creatinine, Ser: 0.66 mg/dL (ref 0.57–1.00)
GFR calc Af Amer: 121 mL/min/{1.73_m2} (ref >59)
GFR calc non Af Amer: 105 mL/min/{1.73_m2} (ref >59)
Globulin, Total: 2.3 g/dL (ref 1.5–4.5)
Glucose: 128 mg/dL — ABNORMAL HIGH (ref 65–99)
Potassium: 4.6 mmol/L (ref 3.5–5.2)
Sodium: 139 mmol/L (ref 134–144)
Total Protein: 6.9 g/dL (ref 6.0–8.5)

## 2019-11-20 LAB — HEPATITIS C ANTIBODY: Hep C Virus Ab: <0.1 s/co ratio (ref 0.0–0.9)

## 2019-11-20 LAB — LDL CHOLESTEROL, DIRECT: LDL Direct: 93 mg/dL (ref 0–99)

## 2019-11-21 ENCOUNTER — Encounter: Payer: Self-pay | Admitting: Family Medicine

## 2019-12-13 ENCOUNTER — Telehealth: Payer: 59 | Admitting: Family Medicine

## 2020-02-01 ENCOUNTER — Other Ambulatory Visit: Payer: Self-pay

## 2020-02-01 ENCOUNTER — Encounter: Payer: Self-pay | Admitting: Family Medicine

## 2020-02-01 ENCOUNTER — Ambulatory Visit (INDEPENDENT_AMBULATORY_CARE_PROVIDER_SITE_OTHER): Payer: 59 | Admitting: Family Medicine

## 2020-02-01 VITALS — BP 110/72 | HR 98 | Ht 63.0 in | Wt 161.1 lb

## 2020-02-01 DIAGNOSIS — R7303 Prediabetes: Secondary | ICD-10-CM | POA: Diagnosis not present

## 2020-02-01 DIAGNOSIS — R002 Palpitations: Secondary | ICD-10-CM

## 2020-02-01 LAB — POCT GLYCOSYLATED HEMOGLOBIN (HGB A1C): Hemoglobin A1C: 6.3 % — AB (ref 4.0–5.6)

## 2020-02-01 NOTE — Progress Notes (Signed)
    SUBJECTIVE:   CHIEF COMPLAINT / HPI:   Pre-Diabetes Follow Up: Patient is a 50 y.o. female who presents today for follow up on her pre-diabetes. Home medications include: metformin 500mg  daily Patient endorses taking these medications as prescribed with excellent compliance.  Most recent A1Cs:  Lab Results  Component Value Date   HGBA1C 6.3 (A) 02/01/2020   HGBA1C 6.2 10/02/2019   HGBA1C 6.4 04/17/2019   Last Microalbumin, LDL, Creatinine: Lab Results  Component Value Date   LDL, direct 93 11/19/2019   CREATININE 0.66 11/19/2019    Palpitations Patient reports intermittent palpitations x1 month. States they occur approximately every 2-3 days. Will sometimes be brief and resolve with deep breathing. Other times will last the majority of the day. Patient wonders if it's related to menopause, which she is starting to go through. Of note, she had thyroid testing recently by OBGYN which was normal. Also had an EKG in the past which was normal.   PERTINENT  PMH / PSH: pre-diabetes, HLD  OBJECTIVE:   BP 110/72   Pulse 98   Ht 5\' 3"  (1.6 m)   Wt 161 lb 2 oz (73.1 kg)   LMP 01/11/2020   SpO2 98%   BMI 28.54 kg/m   Gen: alert, well-appearing, NAD Neck: supple, thyroid normal CV: RRR, normal S1/S2 without m/r/g, 2+ distal pulses Resp: normal WOB, lungs CTAB Neuro: grossly intact   ASSESSMENT/PLAN:   Pre-diabetes Stable, well-controlled. A1c 6.3% today. Her A1c has been essentially unchanged for 6 years now. -Continue Metformin 500mg  daily -Encouraged regular physical activity and counseled re: healthy dietary choices  Palpitations Intermittent palpitations x1 month. Differential includes thyroid abnormality, anxiety, a-fib or a-flutter, symptomatic PVCs. Less likely hyperthyroidism at this point given recently normal thyroid studies by her OBGYN. Patient declines additional workup at this time.  -She will call if increasing in frequency or if she desires further  testing -Return precautions given -Consider EKG and/or holter monitor in the future as needed    COVID Vaccination Patient has not received any of her COVID vaccinations. She is considering it, and will likely decide to get it after her husband gets his (his 1st dose is next week). -Counseling provided  Discussed case with Dr. Altamese Brentwood, [place]

## 2020-02-01 NOTE — Patient Instructions (Addendum)
It was great to see you!  Your A1c is 6.3% today. Keep taking your Metformin and Crestor as prescribed.  If your palpitations become more frequent or bothersome, please call to schedule an appointment.  If you develop shortness of breath, lightheadedness, chest pain, or other concerning symptoms please seek medical care.  Dr. [facility]

## 2020-02-01 NOTE — Assessment & Plan Note (Signed)
Intermittent palpitations x1 month. Differential includes thyroid abnormality, anxiety, a-fib or a-flutter, symptomatic PVCs. Less likely hyperthyroidism at this point given recently normal thyroid studies by her OBGYN. Patient declines additional workup at this time.  -She will call if increasing in frequency or if she desires further testing -Return precautions given -Consider EKG and/or holter monitor in the future as needed

## 2020-02-01 NOTE — Assessment & Plan Note (Signed)
Stable, well-controlled. A1c 6.3% today. Her A1c has been essentially unchanged for 6 years now. -Continue Metformin 500mg  daily -Encouraged regular physical activity and counseled re: healthy dietary choices

## 2020-03-06 ENCOUNTER — Other Ambulatory Visit: Payer: Self-pay

## 2020-03-06 ENCOUNTER — Ambulatory Visit (INDEPENDENT_AMBULATORY_CARE_PROVIDER_SITE_OTHER): Payer: 59 | Admitting: Family Medicine

## 2020-03-06 VITALS — BP 107/60 | HR 105 | Ht 63.0 in | Wt 162.4 lb

## 2020-03-06 DIAGNOSIS — D489 Neoplasm of uncertain behavior, unspecified: Secondary | ICD-10-CM | POA: Diagnosis not present

## 2020-03-06 DIAGNOSIS — L918 Other hypertrophic disorders of the skin: Secondary | ICD-10-CM

## 2020-03-06 DIAGNOSIS — L989 Disorder of the skin and subcutaneous tissue, unspecified: Secondary | ICD-10-CM | POA: Diagnosis not present

## 2020-03-06 DIAGNOSIS — D229 Melanocytic nevi, unspecified: Secondary | ICD-10-CM | POA: Diagnosis not present

## 2020-03-06 NOTE — Patient Instructions (Signed)
It was nice seeing you today. The procedure went well. Please see [place] soon in 4 weeks for your left should lesion reassessment. I will call you soon with your skin biopsy pathology report.

## 2020-03-06 NOTE — Progress Notes (Signed)
SUBJECTIVE:   CHIEF COMPLAINT / HPI:   Skin tags: Here for tag removal which has been present for >6 months. She has one on the back of her neck which get caught on her hair or necklace. Also a few underneath her right breast.   Right neck bump: Started about 7 months ago. Had not increased much in size, but sometimes it is itchy. She will like to take this out.  Shoulder lesion: Left shoulder spot started 1 month ago. It is pink and getting bigger. She denies any other symptoms. She had similar lesion on her nose and abdomen which was thought to be malignant. She was treated for those at [facility]. Her last visit with her dermatologist was 6 months ago. Hx of sun exposure and tanning >10 yrs ago.  PERTINENT  PMH / PSH: PMX reviewed  OBJECTIVE:   BP 107/60   Pulse (!) 105   Ht 5\' 3"  (1.6 m)   Wt 162 lb 6.4 oz (73.7 kg)   SpO2 98%   BMI 28.77 kg/m   Physical Exam Vitals reviewed.  Pulmonary:     Effort: Pulmonary effort is normal.  Skin:         Comments: Small <0.2 mm pedunculated skin tag at the back of her neck - midline. Two similar lesions beneath her right breast crease as well as one on the outer lower quarter of her right breast       ASSESSMENT/PLAN:   Left shoulder lesion: Likely early basal cell CA I discussed excision vs cryotherapy. She had cryotherapy for previous similar lesion opted for that this time. F/U in 4 weeks if there is recurrence See procedure note below.  Multiple skin tags: Skin tag removal completed.  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   Excision of Benign Skin Lesion Procedure Note  PRE-OP DIAGNOSIS: Left should skin lesion/neoplasm of uncertain                                       Multiple skin tags                                      Neck Mole   POST-OP DIAGNOSIS: Same   PROCEDURE: Shave biopsy of  neck lesion, cryotherapy of left shoulder lesion and skin tag removal.  Performing Physician: Zola Button, MD   Supervising Physician (if applicable): Andrena Mews, MD, MPH  PROCEDURE:    Shave Biopsy: Right neck lesion   Scissors: Skin tag removal ( neck, 2 underneath her breast crease and one on the outer quadrant of her right breast)   Cryotherapy: Left shoulder lesion  EXCISION: The area surrounding the skin lesion was prepared and draped in the  usual sterile manner. The lesions (skin tags and mole) was removed in the usual manner by the  biopsy method noted above. Hemostasis was assured.  Closure: Bandage  CRYOTHERAPY: A test freeze was performed ensuring coverage of entire area as above.  The cryotherapy gun was then applied for 3 seconds until an ice ball formed with a 5-7 mm border.  This was allowed to thaw and then the cryotherapy was again applied for 3 seconds to an ice ball of 5-7 mm, total of  three episodes.  Followup: The patient tolerated the procedure well without  complications.  Standard post-procedure care is explained and return  precautions are given.    Andrena Mews, MD [place]

## 2020-03-07 ENCOUNTER — Ambulatory Visit (HOSPITAL_COMMUNITY)
Admission: RE | Admit: 2020-03-07 | Discharge: 2020-03-07 | Disposition: A | Discharge status: Home / Self Care | Payer: 59 | Source: Ambulatory Visit | Attending: Family Medicine | Admitting: Family Medicine

## 2020-03-07 ENCOUNTER — Encounter: Payer: Self-pay | Admitting: Family Medicine

## 2020-03-07 ENCOUNTER — Ambulatory Visit (INDEPENDENT_AMBULATORY_CARE_PROVIDER_SITE_OTHER): Payer: 59 | Admitting: Family Medicine

## 2020-03-07 VITALS — BP 115/60 | HR 107 | Ht 63.0 in | Wt 163.4 lb

## 2020-03-07 DIAGNOSIS — R002 Palpitations: Secondary | ICD-10-CM

## 2020-03-07 NOTE — Assessment & Plan Note (Addendum)
Broad differential of palpitations including thyroid dysfunction, anemia, PVCs, A. Fib, anxiety etc. EKG in clinic: Sinus tachycardia. Pt has been treated for iron defiency anemia in the past she reports she has recently been taken off iron tablets by PCP. Hb wnl 04/17/19 anemia less likely. No significant life stressors.  I suspect her palpitations are cardiac in etiology. Pt has had recurrent episodes since 2017 but did not end up seeing cardiology due to cost. I discussed referring to cardiology now for further work up. Pt is happy to be referred. -Referred to cardiology -TSH to rule out thyroid dysfunction -Strict ER precautions given to patient -Follow-up with PCP

## 2020-03-07 NOTE — Progress Notes (Signed)
     SUBJECTIVE:   CHIEF COMPLAINT / HPI:   Karen Diaz is a 50 y.o. female presents with palpitations  Palpitations Patient was seen 1 month ago in the clinic for intermittent palpitations, at this time she declined further wokr up. Presents today as symptoms have persisted. She gets them 2-3 times a week. Exacerbated by movement. Alleviated by lying down. Monitoring HR at home with pulse oximeter. Assoc sx include right arm pain. Drinks 1 glass of tea/day, on the weekends drinks 6  12 oz on the weekends. Feels this could be due to menopause. Feels more stressed than normal.  Denies chest pain, dizziness, syncopal, weight loss, trauma, sweating, flushing, excessive caffeine intake, fevers, anxiety fluoroquinolones or antipsychotics. No history of family history of arrhythmias, syncope or sudden death from cardiac causes.  PERTINENT  PMH / PSH: Thrombocytosis, prediabetes, HLD   OBJECTIVE:   BP 115/60   Pulse (!) 107   Ht 5\' 3"  (1.6 m)   Wt 163 lb 6.4 oz (74.1 kg)   SpO2 98%   BMI 28.95 kg/m    General: Alert, no acute distress Cardio: Normal S1 and S2, RRR, no r/m/g Pulm: CTAB, normal work of breathing Extremities: No peripheral edema.  Neuro: Cranial nerves grossly intact   ASSESSMENT/PLAN:   Palpitations Broad differential of palpitations including thyroid dysfunction, anemia, PVCs, A. Fib, anxiety etc. EKG in clinic: Sinus tachycardia. Pt has been treated for iron defiency anemia in the past she reports she has recently been taken off iron tablets by PCP. Hb wnl 04/17/19 anemia less likely. No significant life stressors.  I suspect her palpitations are cardiac in etiology. Pt has had recurrent episodes since 2017 but did not end up seeing cardiology due to cost. I discussed referring to cardiology now for further work up. Pt is happy to be referred. -Referred to cardiology -TSH to rule out thyroid dysfunction -Strict ER precautions given to patient -Follow-up with PCP      Lattie Haw, MD PGY-2 [place]

## 2020-03-07 NOTE — Patient Instructions (Addendum)
Thank you for coming to see me today. It was a pleasure. Today we discussed your palpitations. I do not know the cause but it could be related to your heart. I recommend following up with cardiology they will do further tests.  We will get some labs today.  If they are abnormal or we need to do something about them, I will call you.  If they are normal, I will send you a message on MyChart (if it is active) or a letter in the mail.  If you don't hear from [place] in 2 weeks, please call the office at the number below.  If you have any questions or concerns, please do not hesitate to call the office at [phone].  If you develop fevers>100.5, sweating, shortness of breath, chest pain, dizziness, HR >140, abdominal pain, nausea, vomiting, diarrhea or cannot eat or drink then please go to the ER immediately.  Best wishes,   Dr Posey Pronto

## 2020-03-08 LAB — TSH: TSH: 0.81 u[IU]/mL (ref 0.450–4.500)

## 2020-03-09 ENCOUNTER — Encounter: Payer: Self-pay | Admitting: Family Medicine

## 2020-03-11 ENCOUNTER — Telehealth: Payer: Self-pay

## 2020-03-11 NOTE — Telephone Encounter (Signed)
I am happy to place order for CBC and anemia panel. Please could you ask her which day she would like the labs on and ill place the order. She should follow up with her PCP after the results. Thank you.

## 2020-03-11 NOTE — Telephone Encounter (Signed)
Patient calls nurse line requesting to come in for a lab appointment to check iron levels. Patient was seen in the clinic on 03/07/20 for palpitations. Patient continues to report fatigue and headaches.   Patient reports that she has a history of iron deficiency anemia and would like these levels checked.   Please advise if patient can come in for lab appointment or if additional provider appointment is needed.   Talbot Grumbling, RN

## 2020-03-13 ENCOUNTER — Encounter: Payer: Self-pay | Admitting: Cardiology

## 2020-03-13 ENCOUNTER — Other Ambulatory Visit: Payer: Self-pay

## 2020-03-13 ENCOUNTER — Other Ambulatory Visit: Payer: 59

## 2020-03-13 ENCOUNTER — Other Ambulatory Visit: Payer: Self-pay | Admitting: Family Medicine

## 2020-03-13 ENCOUNTER — Ambulatory Visit (INDEPENDENT_AMBULATORY_CARE_PROVIDER_SITE_OTHER): Payer: 59 | Admitting: Cardiology

## 2020-03-13 ENCOUNTER — Ambulatory Visit (INDEPENDENT_AMBULATORY_CARE_PROVIDER_SITE_OTHER): Payer: 59

## 2020-03-13 VITALS — BP 138/82 | HR 94 | Ht 63.0 in | Wt 162.0 lb

## 2020-03-13 DIAGNOSIS — R079 Chest pain, unspecified: Secondary | ICD-10-CM

## 2020-03-13 DIAGNOSIS — R002 Palpitations: Secondary | ICD-10-CM

## 2020-03-13 DIAGNOSIS — Z01812 Encounter for preprocedural laboratory examination: Secondary | ICD-10-CM | POA: Diagnosis not present

## 2020-03-13 DIAGNOSIS — E785 Hyperlipidemia, unspecified: Secondary | ICD-10-CM

## 2020-03-13 DIAGNOSIS — R0600 Dyspnea, unspecified: Secondary | ICD-10-CM | POA: Diagnosis not present

## 2020-03-13 DIAGNOSIS — R0609 Other forms of dyspnea: Secondary | ICD-10-CM

## 2020-03-13 LAB — BASIC METABOLIC PANEL
BUN/Creatinine Ratio: 10 (ref 9–23)
BUN: 7 mg/dL (ref 6–24)
CO2: 20 mmol/L (ref 20–29)
Calcium: 9.7 mg/dL (ref 8.7–10.2)
Chloride: 96 mmol/L (ref 96–106)
Creatinine, Ser: 0.7 mg/dL (ref 0.57–1.00)
GFR calc Af Amer: 118 mL/min/{1.73_m2} (ref >59)
GFR calc non Af Amer: 103 mL/min/{1.73_m2} (ref >59)
Glucose: 127 mg/dL — ABNORMAL HIGH (ref 65–99)
Potassium: 4.8 mmol/L (ref 3.5–5.2)
Sodium: 134 mmol/L (ref 134–144)

## 2020-03-13 MED ORDER — METOPROLOL TARTRATE 100 MG PO TABS: ORAL_TABLET | ORAL | 0 refills | Status: AC

## 2020-03-13 NOTE — Patient Instructions (Signed)
Medication Instructions:  Your physician recommends that you continue on your current medications as directed. Please refer to the Current Medication list given to you today.  Lab Work: BMET today  If you have labs (blood work) drawn today and your tests are completely normal, you will receive your results only by: Marland Kitchen MyChart Message (if you have MyChart) OR . A paper copy in the mail If you have any lab test that is abnormal or we need to change your treatment, we will call you to review the results.   Testing/Procedures: Coronary CTA-see instructions below   ZIO XT- Long Term Monitor Instructions   Your physician has requested you wear your ZIO patch monitor 7 days.   This is a single patch monitor.  [employer] supplies one patch monitor per enrollment.  Additional stickers are not available.   Please do not apply patch if you will be having a Nuclear Stress Test, Echocardiogram, Cardiac CT, MRI, or Chest Xray during the time frame you would be wearing the monitor. The patch cannot be worn during these tests.  You cannot remove and re-apply the ZIO XT patch monitor.   Your ZIO patch monitor will be sent USPS Priority mail from [facility] directly to your home address. The monitor may also be mailed to a PO BOX if home delivery is not available.   It may take 3-5 days to receive your monitor after you have been enrolled.   Once you have received you monitor, please review enclosed instructions.  Your monitor has already been registered assigning a specific monitor serial # to you.   Applying the monitor   Shave hair from upper left chest.   Hold abrader disc by orange tab.  Rub abrader in 40 strokes over left upper chest as indicated in your monitor instructions.   Clean area with 4 enclosed alcohol pads .  Use all pads to assure are is cleaned thoroughly.  Let dry.   Apply patch as indicated in monitor instructions.  Patch will be place under collarbone on left side of  chest with arrow pointing upward.   Rub patch adhesive wings for 2 minutes.Remove white label marked "1".  Remove white label marked "2".  Rub patch adhesive wings for 2 additional minutes.   While looking in a mirror, press and release button in center of patch.  A small green light will flash 3-4 times .  This will be your only indicator the monitor has been turned on.     Do not shower for the first 24 hours.  You may shower after the first 24 hours.   Press button if you feel a symptom. You will hear a small click.  Record Date, Time and Symptom in the Patient Log Book.   When you are ready to remove patch, follow instructions on last 2 pages of Patient Log Book.  Stick patch monitor onto last page of Patient Log Book.   Place Patient Log Book in [place] box.  Use locking tab on box and tape box closed securely.  The [employer] has [employer] on it.  Please place in mailbox as soon as possible.  Your physician should have your test results approximately 7 days after the monitor has been mailed back to [facility].   Call [place] at [phone] if you have questions regarding your ZIO XT patch monitor.  Call them immediately if you see an orange light blinking on your monitor.   If your  monitor falls off in less than 4 days contact our Monitor department at [phone].  If your monitor becomes loose or falls off after 4 days call [employer] at [phone] for suggestions on securing your monitor.   Follow-Up: At [facility], you and your health needs are our priority.  As part of our continuing mission to provide you with exceptional heart care, we have created designated Provider Care Teams.  These Care Teams include your primary Cardiologist (physician) and Advanced Practice Providers (APPs -  Physician Assistants and Nurse Practitioners) who all work together to provide you with the care you need, when you need it.  We recommend signing up for the  patient portal called "MyChart".  Sign up information is provided on this After Visit Summary.  MyChart is used to connect with patients for Virtual Visits (Telemedicine).  Patients are able to view lab/test results, encounter notes, upcoming appointments, etc.  Non-urgent messages can be sent to your provider as well.   To learn more about what you can do with MyChart, go to [email or web address].    Your next appointment:   3 month(s)  The format for your next appointment:   In Person  Provider:   Oswaldo Milian, MD    Coronary CTA instructions:  Your cardiac CT will be scheduled at one of the below locations:   [facility] [address] [place], [place] [address] [phone]  [place] [address] [place], [place] [address] [phone]  If scheduled at [facility], please arrive at the [facility] main entrance (entrance A) of [facility] 30 minutes prior to test start time. Proceed to the [facility] Radiology Department (first floor) to check-in and test prep.  If scheduled at [facility], please arrive 15 mins early for check-in and test prep.  Please follow these instructions carefully (unless otherwise directed):  On the Night Before the Test: . Be sure to Drink plenty of water. . Do not consume any caffeinated/decaffeinated beverages or chocolate 12 hours prior to your test. . Do not take any antihistamines 12 hours prior to your test.  On the Day of the Test: . Drink plenty of water until 1 hour prior to the test. . Do not eat any food 4 hours prior to the test. . You may take your regular medications prior to the test.  . Take metoprolol 100 mg (Lopressor) two hours prior to test. . FEMALES- please wear underwire-free bra if available      After the Test: . Drink plenty of water. . After receiving IV contrast, you may experience a mild  flushed feeling. This is normal. . On occasion, you may experience a mild rash up to 24 hours after the test. This is not dangerous. If this occurs, you can take Benadryl 25 mg and increase your fluid intake. . If you experience trouble breathing, this can be serious. If it is severe call 911 IMMEDIATELY. If it is mild, please call our office. . If you take any of these medications: Glipizide/Metformin, Avandament, Glucavance, please do not take 48 hours after completing test unless otherwise instructed.   Once we have confirmed authorization from your insurance company, we will call you to set up a date and time for your test. Based on how quickly your insurance processes prior authorizations requests, please allow up to 4 weeks to be contacted for scheduling your Cardiac CT appointment. Be advised that routine Cardiac  CT appointments could be scheduled as many as 8 weeks after your provider has ordered it.  For non-scheduling related questions, please contact the cardiac imaging nurse navigator should you have any questions/concerns: Marchia Bond, Cardiac Imaging Nurse Navigator Gordy Clement, Cardiac Imaging Nurse Navigator [facility] Direct Office Dial: [phone]   For scheduling needs, including cancellations and rescheduling, please call [place], [phone].

## 2020-03-13 NOTE — Telephone Encounter (Signed)
Called patient and informed lab orders have been placed. Also scheduled patient with Dr. Rock Nephew on 2/28 at 1505.  Talbot Grumbling, RN

## 2020-03-13 NOTE — Telephone Encounter (Signed)
I have placed the lab order thank you.

## 2020-03-13 NOTE — Progress Notes (Signed)
Cardiology Office Note:    Date:  03/13/2020   ID:  Dallie Piles, DOB 1970/05/14, MRN [id number]  PCP:  Alcus Dad, MD  Cardiologist:  No primary care provider on file.  Electrophysiologist:  None   Referring MD: McDiarmid, Blane Ohara, MD   Chief Complaint  Patient presents with  . Palpitations    History of Present Illness:    Karen Diaz is a 50 y.o. female with a hx of hyperlipidemia, gestational diabetes who is referred by Dr. McDiarmid for evaluation of palpitations.  She reports that she started having palpitations daily in September or October 2021.  States that it feels like her heart is beating irregularly.  Episodes will last for about 10 minutes.  Reports some improvement over the last month, now occurring about every other day.  She denies any lightheadedness or syncope.  Also reports that she had an episode of chest pain recently.  Described as aching pain across her chest, lasted for hours.  Denies exertional chest pain, but reports she gets short of breath with exertion.  States that walking up a flight of stairs causes her to be short of breath.  She denies any lower extremity edema.  She smoked cigarettes as a teenager, none since that time.  Family history includes maternal grandmother had CHF.  Last LDL 93 on 11/19/19, was as high as 243 in 2013.  Normal TSH on 03/07/20.    Past Medical History:  Diagnosis Date  . Gestational diabetes   . Hyperlipidemia 2012  . Iron deficiency anemia 05/02/2017    Past Surgical History:  Procedure Laterality Date  . APPENDECTOMY  1978  . CESAREAN SECTION  1993, 2000  . TUBAL LIGATION  2000    Current Medications: Current Meds  Medication Sig  . Biotin 5 MG CAPS Take 1 capsule by mouth daily.  . metFORMIN (GLUCOPHAGE-XR) 500 MG 24 hr tablet Take 1 tablet (500 mg total) by mouth daily with breakfast.  . metoprolol tartrate (LOPRESSOR) 100 MG tablet Take 100 mg (1 tablet) two hours prior to CT scan  . rosuvastatin  (CRESTOR) 20 MG tablet Take 1 tablet (20 mg total) by mouth daily.     Allergies:   Flagyl [metronidazole]   Social History   Socioeconomic History  . Marital status: Married    Spouse name: Not on file  . Number of children: Not on file  . Years of education: Not on file  . Highest education level: Not on file  Occupational History  . Not on file  Tobacco Use  . Smoking status: Former Research scientist (life sciences)  . Smokeless tobacco: Never Used  . Tobacco comment: quit 29 years ago; previously 3-4cig/day  Substance and Sexual Activity  . Alcohol use: Yes    Comment: on the weekends; 6 beers throughout the weekend  . Drug use: Never  . Sexual activity: Yes    Partners: Male    Birth control/protection: Surgical  Other Topics Concern  . Not on file  Social History Narrative  . Not on file   Social Determinants of Health   Financial Resource Strain: Not on file  Food Insecurity: Not on file  Transportation Needs: Not on file  Physical Activity: Not on file  Stress: Not on file  Social Connections: Not on file     Family History: The patient's family history includes Asthma in her mother; Cancer in her father; Colon cancer in her paternal uncle; Diabetes in her maternal grandfather, maternal grandmother, paternal grandfather, and  paternal grandmother; Esophageal cancer in her father and paternal grandfather; Heart failure in her maternal grandmother; Hypertension in her maternal grandfather and maternal grandmother. There is no history of Stomach cancer or Rectal cancer.  ROS:   Please see the history of present illness.     All other systems reviewed and are negative.  EKGs/Labs/Other Studies Reviewed:    The following studies were reviewed today:   EKG:  EKG is ordered today.  The ekg ordered today demonstrates normal sinus rhythm, rate 94, no ST/T abnormality  Recent Labs: 04/17/2019: Hemoglobin 12.8; Platelets 345 11/19/2019: ALT 32; BUN 8; Creatinine, Ser 0.66; Potassium 4.6;  Sodium 139 03/07/2020: TSH 0.810  Recent Lipid Panel    Component Value Date/Time   CHOL 232 (H) 04/17/2019 1028   TRIG 340 (H) 04/17/2019 1028   HDL 55 04/17/2019 1028   CHOLHDL 4.2 04/17/2019 1028   CHOLHDL 5.4 (H) 03/24/2015 1012   VLDL 48 (H) 03/24/2015 1012   LDLCALC 118 (H) 04/17/2019 1028   LDLDIRECT 93 11/19/2019 1026   LDLDIRECT 210 (H) 07/04/2013 1116    Physical Exam:    VS:  BP 138/82   Pulse 94   Ht 5\' 3"  (1.6 m)   Wt 162 lb (73.5 kg)   BMI 28.70 kg/m     Wt Readings from Last 3 Encounters:  03/13/20 162 lb (73.5 kg)  03/07/20 163 lb 6.4 oz (74.1 kg)  03/06/20 162 lb 6.4 oz (73.7 kg)     GEN: Well nourished, well developed in no acute distress HEENT: Normal NECK: No JVD; No carotid bruits LYMPHATICS: No lymphadenopathy CARDIAC: RRR, no murmurs, rubs, gallops RESPIRATORY:  Clear to auscultation without rales, wheezing or rhonchi  ABDOMEN: Soft, non-tender, non-distended MUSCULOSKELETAL:  No edema; No deformity  SKIN: Warm and dry NEUROLOGIC:  Alert and oriented x 3 PSYCHIATRIC:  Normal affect   ASSESSMENT:    1. Chest pain of uncertain etiology   2. DOE (dyspnea on exertion)   3. Pre-procedure lab exam   4. Palpitations   5. Hyperlipidemia, unspecified hyperlipidemia type    PLAN:     Chest pain/DOE: Reports atypical chest pain, but also having dyspnea with exertion that could represent anginal equivalent.  She does have CAD risk factors, as has hyperlipidemia with markedly elevated LDL in the past (up to 243 in 2013).  Suspect likely FH.  Given this, recommend evaluation to rule out obstructive CAD -Coronary CTA.  Will give Lopressor 100 mg prior to exam  Palpitations: Description concerning for arrhythmia, will evaluate with cardiac monitor x7 days  Hyperlipidemia: Last LDL 93 on 11/19/19, was as high as 243 in 2013.  Suspect FH.  Will follow up results of coronary CTA as above to guide how aggressive to be in lowering cholesterol.  RTC in 3  months  Medication Adjustments/Labs and Tests Ordered: Current medicines are reviewed at length with the patient today.  Concerns regarding medicines are outlined above.  Orders Placed This Encounter  Procedures  . CT CORONARY MORPH W/CTA COR W/SCORE W/CA W/CM &/OR WO/CM  . CT CORONARY FRACTIONAL FLOW RESERVE DATA PREP  . CT CORONARY FRACTIONAL FLOW RESERVE FLUID ANALYSIS  . Basic metabolic panel  . LONG TERM MONITOR (3-14 DAYS)  . EKG 12-Lead   Meds ordered this encounter  Medications  . metoprolol tartrate (LOPRESSOR) 100 MG tablet    Sig: Take 100 mg (1 tablet) two hours prior to CT scan    Dispense:  1 tablet    Refill:  0    Patient Instructions  Medication Instructions:  Your physician recommends that you continue on your current medications as directed. Please refer to the Current Medication list given to you today.  Lab Work: BMET today  If you have labs (blood work) drawn today and your tests are completely normal, you will receive your results only by: Marland Kitchen MyChart Message (if you have MyChart) OR . A paper copy in the mail If you have any lab test that is abnormal or we need to change your treatment, we will call you to review the results.   Testing/Procedures: Coronary CTA-see instructions below   ZIO XT- Long Term Monitor Instructions   Your physician has requested you wear your ZIO patch monitor 7 days.   This is a single patch monitor.  [employer] supplies one patch monitor per enrollment.  Additional stickers are not available.   Please do not apply patch if you will be having a Nuclear Stress Test, Echocardiogram, Cardiac CT, MRI, or Chest Xray during the time frame you would be wearing the monitor. The patch cannot be worn during these tests.  You cannot remove and re-apply the ZIO XT patch monitor.   Your ZIO patch monitor will be sent USPS Priority mail from [facility] directly to your home address. The monitor may also be mailed to a PO BOX if  home delivery is not available.   It may take 3-5 days to receive your monitor after you have been enrolled.   Once you have received you monitor, please review enclosed instructions.  Your monitor has already been registered assigning a specific monitor serial # to you.   Applying the monitor   Shave hair from upper left chest.   Hold abrader disc by orange tab.  Rub abrader in 40 strokes over left upper chest as indicated in your monitor instructions.   Clean area with 4 enclosed alcohol pads .  Use all pads to assure are is cleaned thoroughly.  Let dry.   Apply patch as indicated in monitor instructions.  Patch will be place under collarbone on left side of chest with arrow pointing upward.   Rub patch adhesive wings for 2 minutes.Remove white label marked "1".  Remove white label marked "2".  Rub patch adhesive wings for 2 additional minutes.   While looking in a mirror, press and release button in center of patch.  A small green light will flash 3-4 times .  This will be your only indicator the monitor has been turned on.     Do not shower for the first 24 hours.  You may shower after the first 24 hours.   Press button if you feel a symptom. You will hear a small click.  Record Date, Time and Symptom in the Patient Log Book.   When you are ready to remove patch, follow instructions on last 2 pages of Patient Log Book.  Stick patch monitor onto last page of Patient Log Book.   Place Patient Log Book in [place] box.  Use locking tab on box and tape box closed securely.  The [employer] has [employer] on it.  Please place in mailbox as soon as possible.  Your physician should have your test results approximately 7 days after the monitor has been mailed back to [facility].   Call [place] at [phone] if you have questions regarding your ZIO XT patch monitor.  Call them immediately if you see an orange light blinking  on your monitor.   If your  monitor falls off in less than 4 days contact our Monitor department at [phone].  If your monitor becomes loose or falls off after 4 days call [employer] at [phone] for suggestions on securing your monitor.   Follow-Up: At [facility], you and your health needs are our priority.  As part of our continuing mission to provide you with exceptional heart care, we have created designated Provider Care Teams.  These Care Teams include your primary Cardiologist (physician) and Advanced Practice Providers (APPs -  Physician Assistants and Nurse Practitioners) who all work together to provide you with the care you need, when you need it.  We recommend signing up for the patient portal called "MyChart".  Sign up information is provided on this After Visit Summary.  MyChart is used to connect with patients for Virtual Visits (Telemedicine).  Patients are able to view lab/test results, encounter notes, upcoming appointments, etc.  Non-urgent messages can be sent to your provider as well.   To learn more about what you can do with MyChart, go to [email or web address].    Your next appointment:   3 month(s)  The format for your next appointment:   In Person  Provider:   Oswaldo Milian, MD    Coronary CTA instructions:  Your cardiac CT will be scheduled at one of the below locations:   [facility] [address] [place], [place] [address] [phone]  [place] [address] [place], [place] [address] [phone]  If scheduled at [facility], please arrive at the [facility] main entrance (entrance A) of [facility] 30 minutes prior to test start time. Proceed to the [facility] Radiology Department (first floor) to check-in and test prep.  If scheduled at [facility], please arrive 15 mins early for check-in and test prep.  Please follow these instructions  carefully (unless otherwise directed):  On the Night Before the Test: . Be sure to Drink plenty of water. . Do not consume any caffeinated/decaffeinated beverages or chocolate 12 hours prior to your test. . Do not take any antihistamines 12 hours prior to your test.  On the Day of the Test: . Drink plenty of water until 1 hour prior to the test. . Do not eat any food 4 hours prior to the test. . You may take your regular medications prior to the test.  . Take metoprolol 100 mg (Lopressor) two hours prior to test. . FEMALES- please wear underwire-free bra if available      After the Test: . Drink plenty of water. . After receiving IV contrast, you may experience a mild flushed feeling. This is normal. . On occasion, you may experience a mild rash up to 24 hours after the test. This is not dangerous. If this occurs, you can take Benadryl 25 mg and increase your fluid intake. . If you experience trouble breathing, this can be serious. If it is severe call 911 IMMEDIATELY. If it is mild, please call our office. . If you take any of these medications: Glipizide/Metformin, Avandament, Glucavance, please do not take 48 hours after completing test unless otherwise instructed.   Once we have confirmed authorization from your insurance company, we will call you to set up a date and time for your test. Based on how quickly your insurance processes prior authorizations requests, please allow up to 4 weeks to be contacted for scheduling your Cardiac  CT appointment. Be advised that routine Cardiac CT appointments could be scheduled as many as 8 weeks after your provider has ordered it.  For non-scheduling related questions, please contact the cardiac imaging nurse navigator should you have any questions/concerns: Marchia Bond, Cardiac Imaging Nurse Navigator Gordy Clement, Cardiac Imaging Nurse Navigator [facility] Direct Office Dial: [phone]   For scheduling needs,  including cancellations and rescheduling, please call [place], [phone].       Signed, Donato Heinz, MD  03/13/2020 11:03 AM    [place]

## 2020-03-14 LAB — ANEMIA PANEL
Ferritin: 47 ng/mL (ref 15–150)
Folate, Hemolysate: 359 ng/mL
Folate, RBC: 978 ng/mL (ref >498)
Hematocrit: 36.7 % (ref 34.0–46.6)
Iron Saturation: 7 % — CL (ref 15–55)
Iron: 28 ug/dL (ref 27–159)
Retic Ct Pct: 1.4 % (ref 0.6–2.6)
Total Iron Binding Capacity: 411 ug/dL (ref 250–450)
UIBC: 383 ug/dL (ref 131–425)
Vitamin B-12: 965 pg/mL (ref 232–1245)

## 2020-03-14 LAB — CBC
Hemoglobin: 11.6 g/dL (ref 11.1–15.9)
MCH: 27 pg (ref 26.6–33.0)
MCHC: 31.6 g/dL (ref 31.5–35.7)
MCV: 85 fL (ref 79–97)
Platelets: 392 10*3/uL (ref 150–450)
RBC: 4.3 x10E6/uL (ref 3.77–5.28)
RDW: 14 % (ref 11.7–15.4)
WBC: 10.7 10*3/uL (ref 3.4–10.8)

## 2020-03-23 DIAGNOSIS — R002 Palpitations: Secondary | ICD-10-CM

## 2020-03-23 NOTE — Progress Notes (Addendum)
    SUBJECTIVE:   CHIEF COMPLAINT / HPI:   R Shoulder Pain Patient reports right anterior shoulder pain x1 month. No trauma, repetitive motion, or preceding injury that she can recall. Pain is located in anterior shoulder but also sometimes in scapula and into her arm and neck. Described as a dull ache, no numbness or tingling, not tender to touch. Usually worse with movement or lifting objects. She took a few leftover meloxicam and has also tried heat and ice without any relief. No hx of shoulder problems in the past.  PERTINENT  PMH / PSH: palpitations (currently undergoing workup by cardiology), pre-diabetes, HLD  OBJECTIVE:   BP 128/70   Pulse 87   Ht 5\' 3"  (1.6 m)   Wt 164 lb (74.4 kg)   LMP 02/27/2020   SpO2 97%   BMI 29.05 kg/m   Gen: alert, well-appearing, NAD CV: RRR, normal S1/S2 Resp: normal work of breathing Right Shoulder: Inspection reveals no obvious deformity, atrophy, or asymmetry. No bruising. No swelling Palpation is normal with no TTP over [facility] joint or bicipital groove. Full ROM in flexion, abduction, internal/external rotation, although she has pain with external rotation and abduction at ~90degrees.  Negative Hawkins, belly press, and Speeds. Negative empty can. 5/5 strength with resisted flexion at 20 degrees, although it's painful. 5/5 strength with ER (also painful). 5/5 strength with IR. +cross arm sign. NV intact distally   ASSESSMENT/PLAN:   Right shoulder pain R anterior shoulder pain x1 month without inciting injury. Physical exam demonstrates pain with abduction and external rotation, and +cross arm sign. Otherwise unremarkable exam with full ROM and no significant weakness. Suspect rotator cuff tendinopathy vs. Impingement. No signs of frozen shoulder or bursitis. No known injury or deformity to suggest fracture or dislocation. -Referral to sports medicine for u/s per Dr. Garlan Fillers -Naproxen 500mg  BID x14 days -Referral placed for physical  therapy  Iron deficiency Hgb wnl at 11.6, but anemia panel revealed iron saturation of 7% (iron 28, ferritin 47). No bleeding complaints, including no heavy menses. No concerns for occult bleeding at this time. Not on any meds to reduce iron absorption, no hx of bypass. Possible etiology includes poor nutritional intake vs H. Pylori or Celiac although she has no GI complaints. -Discussed results of recent lab work with patient, all questions answered -Start ferrous sulfate 324 mg daily vs qod per patient tolerability   Alcus Dad, MD [place]

## 2020-03-24 ENCOUNTER — Encounter: Payer: Self-pay | Admitting: Family Medicine

## 2020-03-24 ENCOUNTER — Ambulatory Visit (INDEPENDENT_AMBULATORY_CARE_PROVIDER_SITE_OTHER): Payer: 59 | Admitting: Family Medicine

## 2020-03-24 ENCOUNTER — Other Ambulatory Visit: Payer: Self-pay

## 2020-03-24 VITALS — BP 128/70 | HR 87 | Ht 63.0 in | Wt 164.0 lb

## 2020-03-24 DIAGNOSIS — E611 Iron deficiency: Secondary | ICD-10-CM | POA: Diagnosis not present

## 2020-03-24 DIAGNOSIS — M25511 Pain in right shoulder: Secondary | ICD-10-CM

## 2020-03-24 DIAGNOSIS — M67911 Unspecified disorder of synovium and tendon, right shoulder: Secondary | ICD-10-CM | POA: Diagnosis not present

## 2020-03-24 MED ORDER — NAPROXEN 500 MG PO TABS: 500.0000 mg | ORAL_TABLET | Freq: Two times a day (BID) | ORAL | 0 refills | Status: AC

## 2020-03-24 MED ORDER — FERROUS SULFATE 325 (65 FE) MG PO TBEC: 325.0000 mg | DELAYED_RELEASE_TABLET | Freq: Every day | ORAL | 3 refills | Status: AC

## 2020-03-24 NOTE — Patient Instructions (Signed)
It was great to see you!  Our plans for today:  - I have placed a referral to Sports Medicine. They should call you to schedule an appointment within the next week. - I have also referred you to physical therapy and prescribed an anti-inflammatory medication called Naproxen  Take care and seek immediate care sooner if you develop any concerns.   Dr. [facility]

## 2020-03-25 DIAGNOSIS — E611 Iron deficiency: Secondary | ICD-10-CM | POA: Insufficient documentation

## 2020-03-25 DIAGNOSIS — M25511 Pain in right shoulder: Secondary | ICD-10-CM | POA: Insufficient documentation

## 2020-03-25 NOTE — Assessment & Plan Note (Signed)
R anterior shoulder pain x1 month without inciting injury. Physical exam demonstrates pain with abduction and external rotation, and +cross arm sign. Otherwise unremarkable exam with full ROM and no significant weakness. Suspect rotator cuff tendinopathy vs. Impingement. No signs of frozen shoulder or bursitis. No known injury or deformity to suggest fracture or dislocation. -Referral to sports medicine for u/s per Dr. Garlan Fillers -Naproxen 500mg  BID x14 days -Referral placed for physical therapy

## 2020-03-25 NOTE — Assessment & Plan Note (Signed)
Hgb wnl at 11.6, but anemia panel revealed iron saturation of 7% (iron 28, ferritin 47). -Discussed results of recent lab work with patient, all questions answered -Start ferrous sulfate 324 mg daily vs qod per patient tolerability

## 2020-03-26 ENCOUNTER — Other Ambulatory Visit: Payer: Self-pay

## 2020-03-26 ENCOUNTER — Ambulatory Visit: Payer: Self-pay

## 2020-03-26 ENCOUNTER — Ambulatory Visit (INDEPENDENT_AMBULATORY_CARE_PROVIDER_SITE_OTHER): Payer: 59 | Admitting: Family Medicine

## 2020-03-26 VITALS — BP 128/82 | Ht 63.0 in | Wt 163.0 lb

## 2020-03-26 DIAGNOSIS — M25511 Pain in right shoulder: Secondary | ICD-10-CM

## 2020-03-26 MED ORDER — METHYLPREDNISOLONE ACETATE 40 MG/ML IJ SUSP
40.0000 mg | Freq: Once | INTRAMUSCULAR | Status: AC
  Administered 2020-03-26: 40 mg via INTRA_ARTICULAR

## 2020-03-26 NOTE — Assessment & Plan Note (Addendum)
Patient with signs and symptoms consistent with impingement syndrome and given her history, presentation, clinical exam, and ultrasound findings this seems to be most likely an acute exacerbation of chronic rotator cuff tendinitis.  She could have had an injury long ago which is subsequently scarred down and healed but likely causing repeat irritation.  Given that this is occurred several times and now is responding less favorably to conservative management I did give her an injection as noted below. -Also advise she go to formal physical therapy at least for a few sessions to get an evaluation get some strengthening and learn a good home exercise program - She can continue with naproxen as needed -Follow-up in 4 to 6 weeks however if she is completely better she can cancel.

## 2020-03-26 NOTE — Patient Instructions (Signed)
It was great to meet you today! Thank you for letting me participate in your care!  Today, we discussed your right shoulder pain which is most likely due to rotator cuff tendinitis. The injection I gave you today should work quickly and should help you in terms of pain. I do think it is important to go to formal physical therapy to strengthen the muscles and learn a good home exercise program. You can continue taking the Naproxen as needed.   I will follow up with you in 4-6 weeks but if you are completely better you can cancel.  Be well, Harolyn Rutherford, DO PGY-4, Sports Medicine Fellow [place]

## 2020-03-26 NOTE — Progress Notes (Addendum)
SUBJECTIVE:   CHIEF COMPLAINT / HPI:   Right shoulder pain Karen Diaz is a very pleasant 50 year old female who presents today for about 1 month of right shoulder pain.  She has had pain like this before in her shoulder however it usually goes away in 1 to 2 weeks with rest and over-the-counter NSAIDs.  This time has been different as this has been going on for 1 month and has not responded to those conservative treatments.  She has tried meloxicam and she has some of this left over from last time and ibuprofen and Tylenol, ice, and heat without much relief.  She is having difficulty sleeping on that side and avoids laying on it now.  The pain is worse with activity but also has a baseline steady ache.  Certain overhead movements tend to exacerbate the pain such as overhead movements.  Pain seems to be concentrated in the lateral and anterior shoulder and sometimes radiates down to the biceps.  She is now taking naproxen 500 mg twice daily as she was recently seen by her primary care provider and that does seem to be helping some.  PERTINENT  PMH / PSH: History of hyperlipidemia, iron deficiency, palpitations, prediabetes  OBJECTIVE:   BP 128/82   Ht 5\' 3"  (1.6 m)   Wt 163 lb (73.9 kg)   LMP 02/27/2020   BMI 28.87 kg/m   No flowsheet data found. Shoulder, Right: No evidence of bony deformity, asymmetry, or muscle atrophy; Positive for tenderness over long head of biceps (bicipital groove). No TTP at [facility] joint. Full active and passive range of motion (180 flex [name] /150Abd /90ER /70IR) but pain with internal rotation, Thumb to T12 with significant tenderness. Strength 5/5 throughout. No abnormal scapular function observed. Sensation intact. Peripheral pulses intact.   Special Tests:   - Crossarm test: NEG - Jobe test: Negative for weakness but Positive for pain   - Hawkins: Positive   - Neer test: NEG   - Gerber lift-off test: NEG, but positive for pain   - Belly press  test: NEG, painful   - Drop arm test: NEG   - Obrien's test: NEG   - Speeds test: NEG  [place] shoulder: Right shoulder -Biceps tendon: Well visualized within the bicipital groove.  There is no calcification in the proximal biceps. No fluid accumulation within the groove, negative bullseye sign. Some irregularity of the fibers of the proximal biceps tendon but no tears. -Pectoralis: Insertion visualized and without abnormalities. -Subscapularis: Well visualized to insertion point on humerus.  No abnormalities.  Dynamic testing over the coracoid did not show signs of impingement. -AC joint: Mild osteophytes and some/no fluid collection, no significant separation -Supraspinatus: Small area of hypoechoic change seen within the intratendenious structure and small area of hyperechoic change at the humeral head at the insertion of the supraspinatous. No full thickness fiber disruption seen of the supraspinatus.   -Subacromial bursa: No bursal swelling -Infraspinatus/teres minor: Insertion point on posterior humerus visualized and without abnormalities. Conclusion:  Small area of hypoechoic changes and slight fiber disruption seen within the intrasubstance of the supraspinatus tendon more consistent with chronic damage of the supraspinatus tendon.  Favor rotator cuff tendinitis.    ASSESSMENT/PLAN:   Right shoulder pain Patient with signs and symptoms consistent with impingement syndrome and given her history, presentation, clinical exam, and ultrasound findings this seems to be most likely an acute exacerbation of chronic rotator cuff tendinitis.  She could have had an injury long  ago which is subsequently scarred down and healed but likely causing repeat irritation.  Given that this is occurred several times and now is responding less favorably to conservative management I did give her an injection as noted below. -Also advise she go to formal physical therapy at least for a few sessions to get an  evaluation get some strengthening and learn a good home exercise program - She can continue with naproxen as needed -Follow-up in 4 to 6 weeks however if she is completely better she can cancel.   Procedure Written and verbal consent was obtained after discussing the risks and benefits of the procedure with the patient. A timeout was performed and the correct site and side was identified and confirmed.  The right shoulder was cleaned in sterile fashion betadine and alcohol pad. 1cc of 40 mg Depo-medrol and 3 cc 1% Lidocaine was injected using a posteriolateral approach directing the needle medial and anterior using a 5 cc syringe and 25 gauge 1 and 1/2 in needle. No complications were encountered. Minimal blood loss. A band aid was applied.    Nuala Alpha, DO PGY-4, Sports Medicine Fellow [place]  I was the preceptor for this visit and available for immediate consultation Shellia Cleverly, DO

## 2020-03-31 ENCOUNTER — Ambulatory Visit: Payer: 59 | Admitting: Physical Therapy

## 2020-03-31 ENCOUNTER — Other Ambulatory Visit: Payer: Self-pay

## 2020-05-08 ENCOUNTER — Other Ambulatory Visit: Payer: Self-pay | Admitting: *Deleted

## 2020-05-08 DIAGNOSIS — R079 Chest pain, unspecified: Secondary | ICD-10-CM

## 2020-05-09 ENCOUNTER — Other Ambulatory Visit: Payer: Self-pay | Admitting: *Deleted

## 2020-05-09 DIAGNOSIS — Z01812 Encounter for preprocedural laboratory examination: Secondary | ICD-10-CM

## 2020-05-09 DIAGNOSIS — R079 Chest pain, unspecified: Secondary | ICD-10-CM

## 2020-05-12 ENCOUNTER — Telehealth: Payer: Self-pay | Admitting: Cardiology

## 2020-05-12 NOTE — Telephone Encounter (Signed)
Patient would like someone to call her to go over her monitor results.

## 2020-05-13 NOTE — Telephone Encounter (Signed)
Spoke to patient and discussed monitor results.  She was scheduled for CCTA today and cancelled due to cost (she has not met her deductable at this time).      She does report that she is not longer having palpitations, CP or SOB.    Advised will make MD aware and to call back if symptoms return or with any questions or concerns.  Patient verbalized understanding.

## 2020-05-13 NOTE — Telephone Encounter (Signed)
Given suspected FH, would recommend she continue to follow with cardiology, could see her back in 1 year

## 2020-05-14 ENCOUNTER — Encounter: Payer: Self-pay | Admitting: *Deleted

## 2020-05-14 NOTE — Telephone Encounter (Signed)
Recall placed and message sent to patient.

## 2020-05-20 ENCOUNTER — Ambulatory Visit (HOSPITAL_COMMUNITY): Payer: 59

## 2020-10-06 ENCOUNTER — Other Ambulatory Visit: Payer: Self-pay

## 2020-10-06 ENCOUNTER — Ambulatory Visit
Admission: RE | Admit: 2020-10-06 | Discharge: 2020-10-06 | Disposition: A | Discharge status: Home / Self Care | Payer: 59 | Source: Ambulatory Visit | Attending: Podiatry | Admitting: Podiatry

## 2020-10-06 ENCOUNTER — Other Ambulatory Visit: Payer: Self-pay | Admitting: Family Medicine

## 2020-10-06 DIAGNOSIS — Z1231 Encounter for screening mammogram for malignant neoplasm of breast: Secondary | ICD-10-CM

## 2021-01-31 ENCOUNTER — Other Ambulatory Visit: Payer: Self-pay

## 2021-01-31 ENCOUNTER — Ambulatory Visit
Admission: EM | Admit: 2021-01-31 | Discharge: 2021-01-31 | Disposition: A | Discharge status: Home / Self Care | Payer: Self-pay

## 2021-01-31 DIAGNOSIS — R051 Acute cough: Secondary | ICD-10-CM

## 2021-01-31 DIAGNOSIS — Z20822 Contact with and (suspected) exposure to covid-19: Secondary | ICD-10-CM

## 2021-01-31 NOTE — ED Provider Notes (Signed)
[name]    CSN: [id number] Arrival date & time: 01/31/21  1419      History   Chief Complaint Chief Complaint  Patient presents with   Cough    HPI Karen Diaz is a 51 y.o. female.   Patient here c/w cough x 1 day.  Husband sick with similar sx.  Exposed to [place] 1 week ago, she has not had COVID vaccine.  She does have history of DM.  Admits nasal congestion, rhinorrhea, denies f/c, n/v/d, abdominal pain, wheezing, SOB.     Past Medical History:  Diagnosis Date   Gestational diabetes    Hyperlipidemia 2012   Iron deficiency anemia 05/02/2017    Patient Active Problem List   Diagnosis Date Noted   Iron deficiency 03/25/2020   Right shoulder pain 03/25/2020   Alcohol intake above recommended sensible limits 11/19/2019   Skin tag 04/17/2019   Thrombocytosis 04/17/2019   Pre-diabetes 05/02/2017   Palpitations 03/24/2015   Overweight 11/30/2012   Hyperlipidemia 02/01/2011   Healthcare maintenance 02/01/2011    Past Surgical History:  Procedure Laterality Date   [place]    OB History   No obstetric history on file.      Home Medications    Prior to Admission medications   Medication Sig Start Date End Date Taking? Authorizing Provider  Biotin 5 MG CAPS Take 1 capsule by mouth daily.    [provider]  ferrous sulfate 325 (65 FE) MG EC tablet Take 1 tablet (325 mg total) by mouth daily with breakfast. 03/24/20   Alcus Dad, MD  metFORMIN (GLUCOPHAGE-XR) 500 MG 24 hr tablet Take 1 tablet (500 mg total) by mouth daily with breakfast. 04/17/19   Caroline More, DO  metoprolol tartrate (LOPRESSOR) 100 MG tablet Take 100 mg (1 tablet) two hours prior to CT scan 03/13/20   Donato Heinz, MD  naproxen (NAPROSYN) 500 MG tablet Take 1 tablet (500 mg total) by mouth 2 (two) times daily with a meal. 03/24/20   Alcus Dad, MD  OZEMPIC, 0.25 OR 0.5 MG/DOSE, 2  MG/1.5ML SOPN SMARTSIG:1 SUB-Q Once a Week 01/12/21   [provider]  rosuvastatin (CRESTOR) 20 MG tablet Take 1 tablet (20 mg total) by mouth daily. 11/01/19   Benay Pike, MD    Family History Family History  Problem Relation Age of Onset   Asthma Mother    Cancer Father        colon/breast   Esophageal cancer Father    Breast cancer Paternal Aunt    Colon cancer Paternal Uncle    Breast cancer Maternal Grandmother    Diabetes Maternal Grandmother    Heart failure Maternal Grandmother    Hypertension Maternal Grandmother    Diabetes Maternal Grandfather    Hypertension Maternal Grandfather    Diabetes Paternal Grandmother    Diabetes Paternal Grandfather    Esophageal cancer Paternal Grandfather    Stomach cancer Neg Hx    Rectal cancer Neg Hx     Social History Social History   Tobacco Use   Smoking status: Former   Smokeless tobacco: Never   Tobacco comments:    quit 29 years ago; previously 3-4cig/day  Substance Use Topics   Alcohol use: Yes    Comment: on the weekends; 6 beers throughout the weekend   Drug use: Never     Allergies   Flagyl [metronidazole]  Review of Systems Review of Systems  Constitutional:  Positive for fatigue. Negative for chills and fever.  HENT:  Positive for congestion and rhinorrhea. Negative for ear pain, nosebleeds, postnasal drip, sinus pressure, sinus pain and sore throat.   Eyes:  Negative for pain and redness.  Respiratory:  Positive for cough. Negative for shortness of breath and wheezing.   Gastrointestinal:  Negative for abdominal pain, diarrhea, nausea and vomiting.  Musculoskeletal:  Negative for arthralgias and myalgias.  Skin:  Negative for rash.  Neurological:  Negative for light-headedness and headaches.  Hematological:  Negative for adenopathy. Does not bruise/bleed easily.  Psychiatric/Behavioral:  Negative for confusion and sleep disturbance.     Physical Exam Triage Vital Signs ED Triage  Vitals  Enc Vitals Group     BP 01/31/21 1530 127/84     Pulse Rate 01/31/21 1530 89     Resp 01/31/21 1530 18     Temp 01/31/21 1530 98.1 F (36.7 C)     Temp Source 01/31/21 1530 Oral     SpO2 01/31/21 1530 97 %     Weight --      Height --      Head Circumference --      Peak Flow --      Pain Score 01/31/21 1528 0     Pain Loc --      Pain Edu? --      Excl. in [place]? --    No data found.  Updated Vital Signs BP 127/84 (BP Location: Left Arm)    Pulse 89    Temp 98.1 F (36.7 C) (Oral)    Resp 18    SpO2 97%   Visual Acuity Right Eye Distance:   Left Eye Distance:   Bilateral Distance:    Right Eye Near:   Left Eye Near:    Bilateral Near:     Physical Exam Vitals and nursing note reviewed.  Constitutional:      General: She is not in acute distress.    Appearance: Normal appearance. She is not ill-appearing.  HENT:     Head: Normocephalic and atraumatic.     Right Ear: Tympanic membrane and ear canal normal.     Left Ear: Tympanic membrane and ear canal normal.     Nose: Rhinorrhea present. No congestion.     Mouth/Throat:     Pharynx: No oropharyngeal exudate or posterior oropharyngeal erythema.  Eyes:     General: No scleral icterus.    Extraocular Movements: Extraocular movements intact.     Conjunctiva/sclera: Conjunctivae normal.  Cardiovascular:     Rate and Rhythm: Normal rate and regular rhythm.     Heart sounds: No murmur heard. Pulmonary:     Effort: Pulmonary effort is normal. No respiratory distress.     Breath sounds: Normal breath sounds. No wheezing or rales.  Musculoskeletal:     Cervical back: Normal range of motion. No rigidity.  Skin:    General: Skin is warm.     Findings: No rash.  Neurological:     General: No focal deficit present.     Mental Status: She is alert and oriented to person, place, and time.     Motor: No weakness.     Gait: Gait normal.  Psychiatric:        Mood and Affect: Mood normal.        Behavior: Behavior  normal.     UC Treatments / Results  Labs (all labs ordered are listed,  but only abnormal results are displayed) Labs Reviewed  NOVEL CORONAVIRUS, NAA    EKG   Radiology No results found.  Procedures Procedures (including critical care time)  Medications Ordered in UC Medications - No data to display  Initial Impression / Assessment and Plan / UC Course  I have reviewed the triage vital signs and the nursing notes.  Pertinent labs & imaging results that were available during my care of the patient were reviewed by me and considered in my medical decision making (see chart for details).     Take ibuprofen or tylenol as needed Go to ED if you have any worsening symptoms  Remain in isolation pending COVID test results Results will be available via mychart in 2 - 3 days. Final Clinical Impressions(s) / UC Diagnoses   Final diagnoses:  Encounter for laboratory testing for COVID-19 virus  Acute cough     Discharge Instructions      Take ibuprofen or tylenol as needed Go to ED if you have any worsening symptoms  Remain in isolation pending COVID test results Results will be available via mychart in 2 - 3 days.    ED Prescriptions   None    PDMP not reviewed this encounter.   Peri Jefferson, PA-C 01/31/21 1609

## 2021-01-31 NOTE — ED Triage Notes (Signed)
Onset yesterday morning of body aches, chills, sore throat, sinus pressure, fatigue and cough. Confirms nausea. Denies v/d. Has been taking ibuprofen and tylenol with temporary relief.   Husband with similar sxs.

## 2021-01-31 NOTE — Discharge Instructions (Addendum)
Take ibuprofen or tylenol as needed Go to ED if you have any worsening symptoms  Remain in isolation pending COVID test results Results will be available via mychart in 2 - 3 days.

## 2021-02-02 LAB — SARS-COV-2, NAA 2 DAY TAT

## 2021-02-02 LAB — NOVEL CORONAVIRUS, NAA: SARS-CoV-2, NAA: DETECTED — AB

## 2021-06-11 ENCOUNTER — Ambulatory Visit
Admission: EM | Admit: 2021-06-11 | Discharge: 2021-06-11 | Disposition: A | Discharge status: Home / Self Care | Payer: 59 | Attending: Family Medicine | Admitting: Family Medicine

## 2021-06-11 DIAGNOSIS — J020 Streptococcal pharyngitis: Secondary | ICD-10-CM

## 2021-06-11 LAB — POCT RAPID STREP A (OFFICE): Rapid Strep A Screen: POSITIVE — AB

## 2021-06-11 MED ORDER — AMOXICILLIN 875 MG PO TABS: 875.0000 mg | ORAL_TABLET | Freq: Two times a day (BID) | ORAL | 0 refills | Status: AC

## 2021-06-11 MED ORDER — KETOROLAC TROMETHAMINE 30 MG/ML IJ SOLN
30.0000 mg | Freq: Once | INTRAMUSCULAR | Status: AC
  Administered 2021-06-11: 30 mg via INTRAMUSCULAR

## 2021-06-11 NOTE — ED Triage Notes (Signed)
Patient presents to Urgent Care with complaints of sore throat, right otalgia, ha since yesterday morning. Patient reports salt water rinses and tylenol and ibuprofen.

## 2021-06-11 NOTE — ED Provider Notes (Signed)
[name]    CSN: [id number] Arrival date & time: 06/11/21  1804      History   Chief Complaint Chief Complaint  Patient presents with   Sore Throat    HPI Karen Diaz is a 51 y.o. female.    Sore Throat  Here for sore throat since yesterday AM. Right ear also hurts. Not really any cough/congestion/n/v. No fever  Past Medical History:  Diagnosis Date   Gestational diabetes    Hyperlipidemia 2012   Iron deficiency anemia 05/02/2017    Patient Active Problem List   Diagnosis Date Noted   Iron deficiency 03/25/2020   Right shoulder pain 03/25/2020   Alcohol intake above recommended sensible limits 11/19/2019   Skin tag 04/17/2019   Thrombocytosis 04/17/2019   Pre-diabetes 05/02/2017   Palpitations 03/24/2015   Overweight 11/30/2012   Hyperlipidemia 02/01/2011   Healthcare maintenance 02/01/2011    Past Surgical History:  Procedure Laterality Date   [place]    OB History   No obstetric history on file.      Home Medications    Prior to Admission medications   Medication Sig Start Date End Date Taking? Authorizing Provider  amoxicillin (AMOXIL) 875 MG tablet Take 1 tablet (875 mg total) by mouth 2 (two) times daily for 10 days. 06/11/21 06/21/21 Yes Barrett Henle, MD  Biotin 5 MG CAPS Take 1 capsule by mouth daily.    [provider]  ferrous sulfate 325 (65 FE) MG EC tablet Take 1 tablet (325 mg total) by mouth daily with breakfast. 03/24/20   Alcus Dad, MD  metFORMIN (GLUCOPHAGE-XR) 500 MG 24 hr tablet Take 1 tablet (500 mg total) by mouth daily with breakfast. Patient not taking: Reported on 06/11/2021 04/17/19   Caroline More, DO  metoprolol tartrate (LOPRESSOR) 100 MG tablet Take 100 mg (1 tablet) two hours prior to CT scan 03/13/20   Donato Heinz, MD  naproxen (NAPROSYN) 500 MG tablet Take 1 tablet (500 mg total) by mouth 2 (two) times  daily with a meal. 03/24/20   Alcus Dad, MD  OZEMPIC, 0.25 OR 0.5 MG/DOSE, 2 MG/1.5ML SOPN SMARTSIG:1 SUB-Q Once a Week 01/12/21   [provider]  rosuvastatin (CRESTOR) 20 MG tablet Take 1 tablet (20 mg total) by mouth daily. 11/01/19   Benay Pike, MD    Family History Family History  Problem Relation Age of Onset   Asthma Mother    Cancer Father        colon/breast   Esophageal cancer Father    Breast cancer Paternal Aunt    Colon cancer Paternal Uncle    Breast cancer Maternal Grandmother    Diabetes Maternal Grandmother    Heart failure Maternal Grandmother    Hypertension Maternal Grandmother    Diabetes Maternal Grandfather    Hypertension Maternal Grandfather    Diabetes Paternal Grandmother    Diabetes Paternal Grandfather    Esophageal cancer Paternal Grandfather    Stomach cancer Neg Hx    Rectal cancer Neg Hx     Social History Social History   Tobacco Use   Smoking status: Former   Smokeless tobacco: Never   Tobacco comments:    quit 29 years ago; previously 3-4cig/day  Substance Use Topics   Alcohol use: Yes    Comment: on the weekends; 6 beers throughout the weekend   Drug use: Never  Allergies   Flagyl [metronidazole]   Review of Systems Review of Systems   Physical Exam Triage Vital Signs ED Triage Vitals  Enc Vitals Group     BP 06/11/21 1809 (!) 152/92     Pulse Rate 06/11/21 1809 90     Resp 06/11/21 1809 14     Temp 06/11/21 1809 97.9 F (36.6 C)     Temp Source 06/11/21 1809 Oral     SpO2 06/11/21 1809 96 %     Weight --      Height --      Head Circumference --      Peak Flow --      Pain Score 06/11/21 1816 10     Pain Loc --      Pain Edu? --      Excl. in [place]? --    No data found.  Updated Vital Signs BP (!) 152/92 (BP Location: Right Arm)   Pulse 90   Temp 97.9 F (36.6 C) (Oral)   Resp 14   SpO2 96%   Visual Acuity Right Eye Distance:   Left Eye Distance:   Bilateral Distance:    Right  Eye Near:   Left Eye Near:    Bilateral Near:     Physical Exam Vitals reviewed.  Constitutional:      General: She is not in acute distress.    Appearance: She is not toxic-appearing.  HENT:     Right Ear: Tympanic membrane and ear canal normal.     Left Ear: Tympanic membrane and ear canal normal.     Nose: Nose normal.     Mouth/Throat:     Mouth: Mucous membranes are moist.     Comments: Tonsils are bilaterally erythematous and have yellow exudate. No asymmetry. Eyes:     Extraocular Movements: Extraocular movements intact.     Conjunctiva/sclera: Conjunctivae normal.     Pupils: Pupils are equal, round, and reactive to light.  Cardiovascular:     Rate and Rhythm: Normal rate and regular rhythm.     Heart sounds: No murmur heard. Pulmonary:     Effort: Pulmonary effort is normal. No respiratory distress.     Breath sounds: No wheezing, rhonchi or rales.  Chest:     Chest wall: No tenderness.  Musculoskeletal:     Cervical back: Neck supple.  Lymphadenopathy:     Cervical: No cervical adenopathy.  Skin:    Capillary Refill: Capillary refill takes less than 2 seconds.     Coloration: Skin is not jaundiced or pale.  Neurological:     General: No focal deficit present.     Mental Status: She is alert and oriented to person, place, and time.  Psychiatric:        Behavior: Behavior normal.     UC Treatments / Results  Labs (all labs ordered are listed, but only abnormal results are displayed) Labs Reviewed  POCT RAPID STREP A (OFFICE) - Abnormal; Notable for the following components:      Result Value   Rapid Strep A Screen Positive (*)    All other components within normal limits    EKG   Radiology No results found.  Procedures Procedures (including critical care time)  Medications Ordered in UC Medications  ketorolac (TORADOL) 30 MG/ML injection 30 mg (has no administration in time range)    Initial Impression / Assessment and Plan / UC Course  I  have reviewed the triage vital signs and the nursing notes.  Pertinent labs & imaging results that were available during my care of the patient were reviewed by me and considered in my medical decision making (see chart for details).     Strep positive. Will treat with amoxil.  Final Clinical Impressions(s) / UC Diagnoses   Final diagnoses:  Strep pharyngitis     Discharge Instructions      You have been given a shot of Toradol 30 mg today.   Your strep test is positive, so antibiotic has been sent in for the strep throat infection.   Take amoxicillin 875 mg--1 tab twice daily for 10 days   You can take the meloxiam 7.5 mg--1-2 daily for pain for 2-3 days.     ED Prescriptions     Medication Sig Dispense Auth. Provider   amoxicillin (AMOXIL) 875 MG tablet Take 1 tablet (875 mg total) by mouth 2 (two) times daily for 10 days. 20 tablet Miku Udall, Gwenlyn Perking, MD      PDMP not reviewed this encounter.   Barrett Henle, MD 06/11/21 [phone]

## 2021-06-11 NOTE — Discharge Instructions (Addendum)
You have been given a shot of Toradol 30 mg today.   Your strep test is positive, so antibiotic has been sent in for the strep throat infection.   Take amoxicillin 875 mg--1 tab twice daily for 10 days   You can take the meloxiam 7.5 mg--1-2 daily for pain for 2-3 days.

## 2021-06-30 ENCOUNTER — Encounter: Payer: Self-pay | Admitting: *Deleted

## 2021-10-05 ENCOUNTER — Other Ambulatory Visit: Payer: Self-pay | Admitting: Internal Medicine

## 2021-10-05 DIAGNOSIS — Z1231 Encounter for screening mammogram for malignant neoplasm of breast: Secondary | ICD-10-CM

## 2021-10-21 ENCOUNTER — Ambulatory Visit: Payer: 59

## 2021-11-02 ENCOUNTER — Ambulatory Visit
Admission: RE | Admit: 2021-11-02 | Discharge: 2021-11-02 | Disposition: A | Discharge status: Home / Self Care | Payer: Commercial Managed Care - HMO | Source: Ambulatory Visit | Attending: Internal Medicine | Admitting: Internal Medicine

## 2021-11-02 DIAGNOSIS — Z1231 Encounter for screening mammogram for malignant neoplasm of breast: Secondary | ICD-10-CM

## 2021-11-24 ENCOUNTER — Encounter: Payer: Self-pay | Admitting: Nurse Practitioner

## 2021-12-30 ENCOUNTER — Ambulatory Visit: Payer: Commercial Managed Care - HMO | Admitting: Nurse Practitioner

## 2022-02-02 ENCOUNTER — Ambulatory Visit: Payer: Commercial Managed Care - HMO | Admitting: Nurse Practitioner

## 2022-02-07 IMAGING — MG MM DIGITAL SCREENING BILAT W/ TOMO AND CAD
8 series · 8 of 24 positions shown · non-contrast
Comparison: Previous exam(s).

CLINICAL DATA: Screening.

EXAM:
DIGITAL SCREENING BILATERAL MAMMOGRAM WITH TOMOSYNTHESIS AND CAD
TECHNIQUE: Bilateral screening digital craniocaudal and mediolateral oblique
mammograms were obtained. Bilateral screening digital breast
tomosynthesis was performed. The images were evaluated with
computer-aided detection.

[R MLO synth-2D]
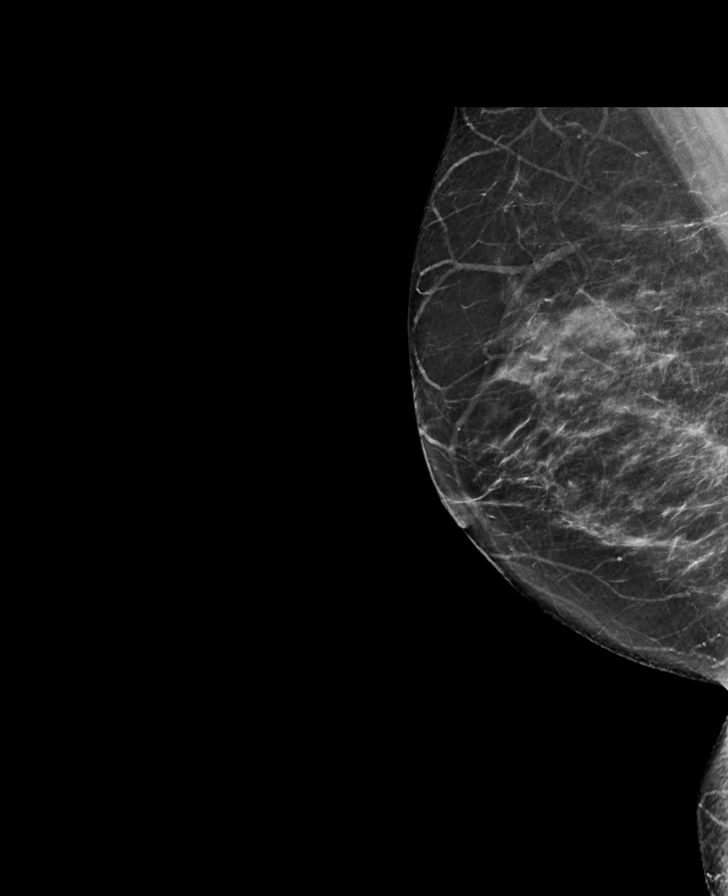

[L CC synth-2D]
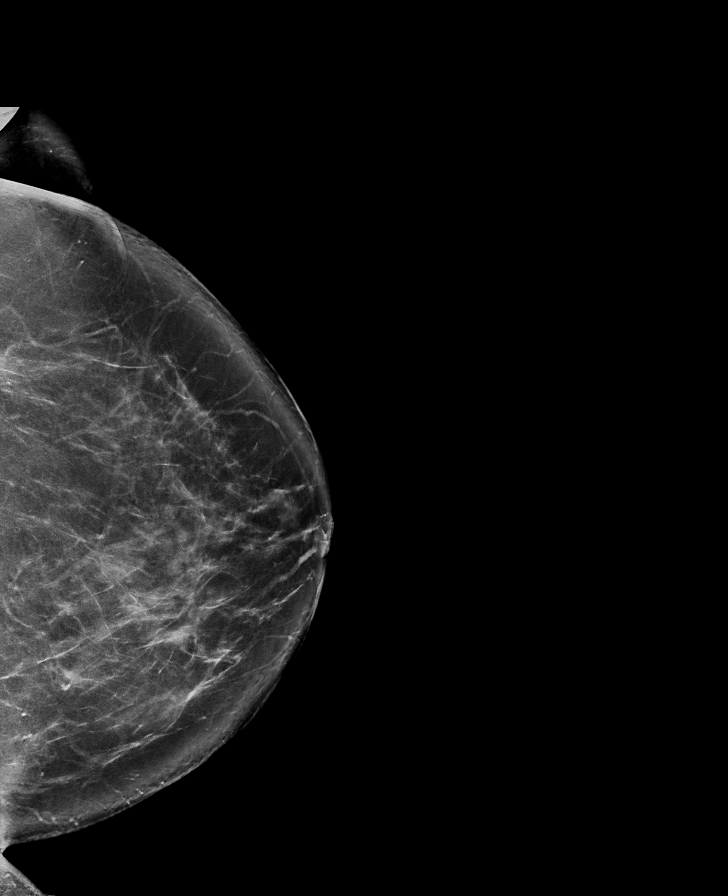

[R CC synth-2D]
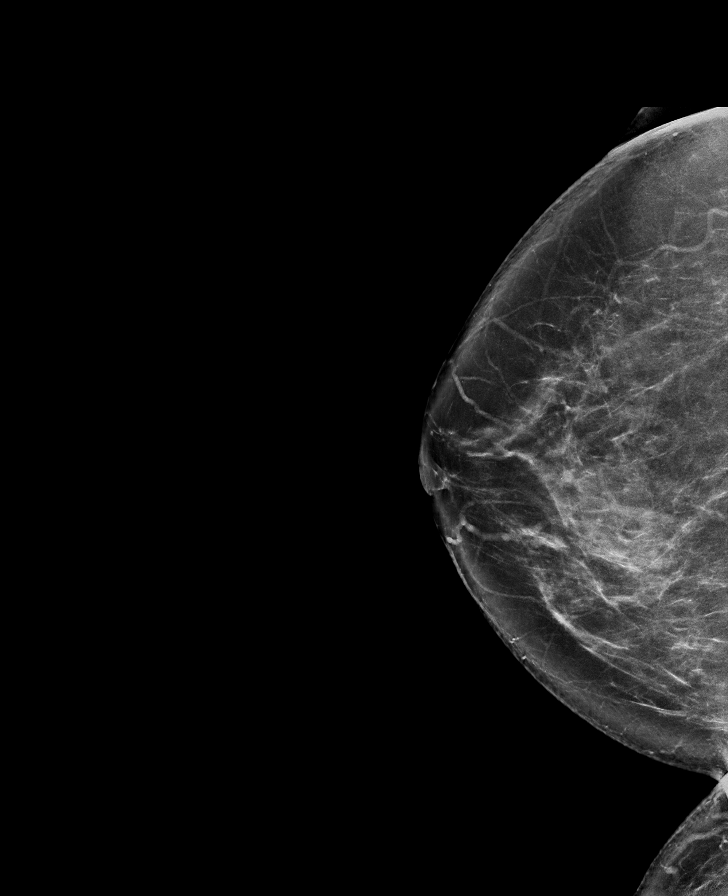

[L MLO synth-2D]
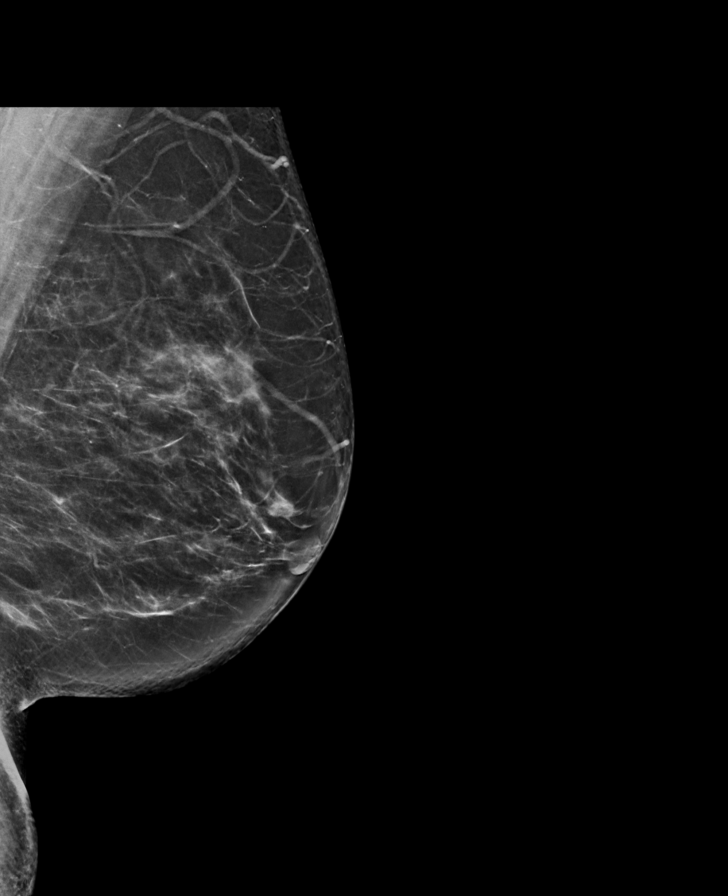

[L CC tomo · tomo slice 45/90.0]
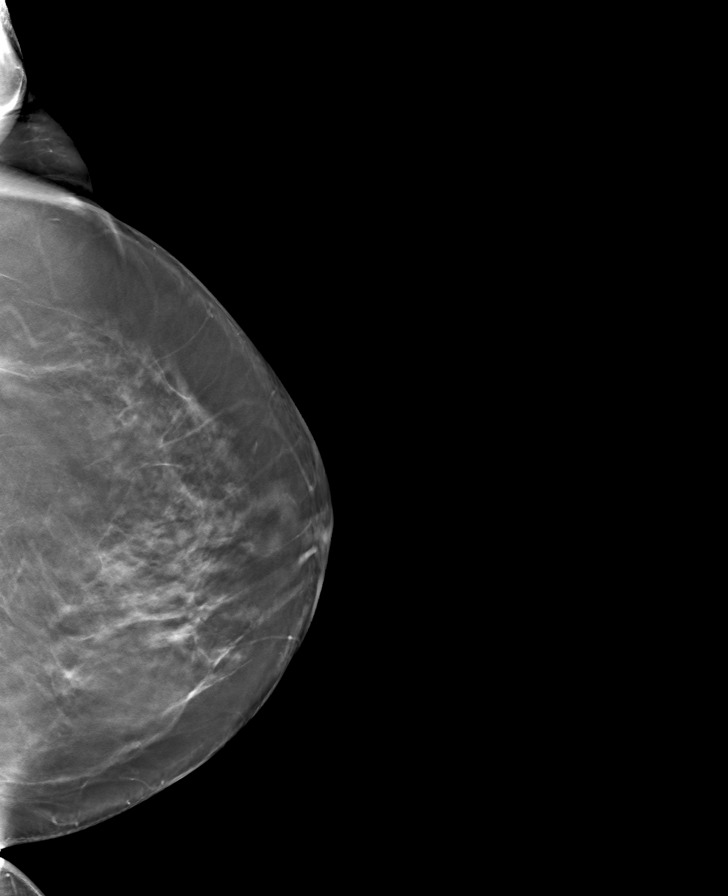

[R CC tomo · tomo slice 45/90.0]
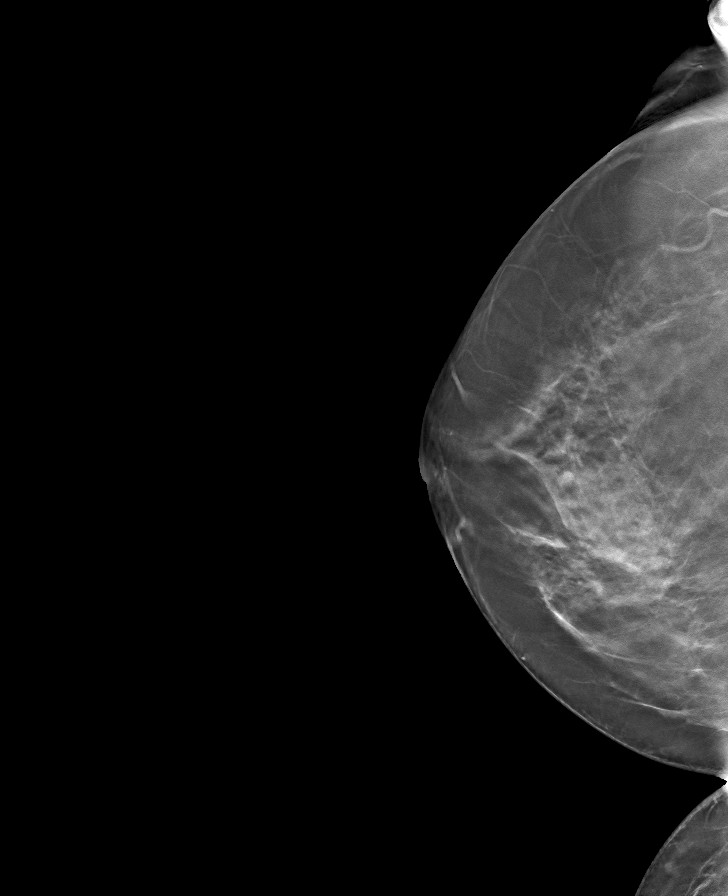

[L MLO tomo · tomo slice 39/78.0]
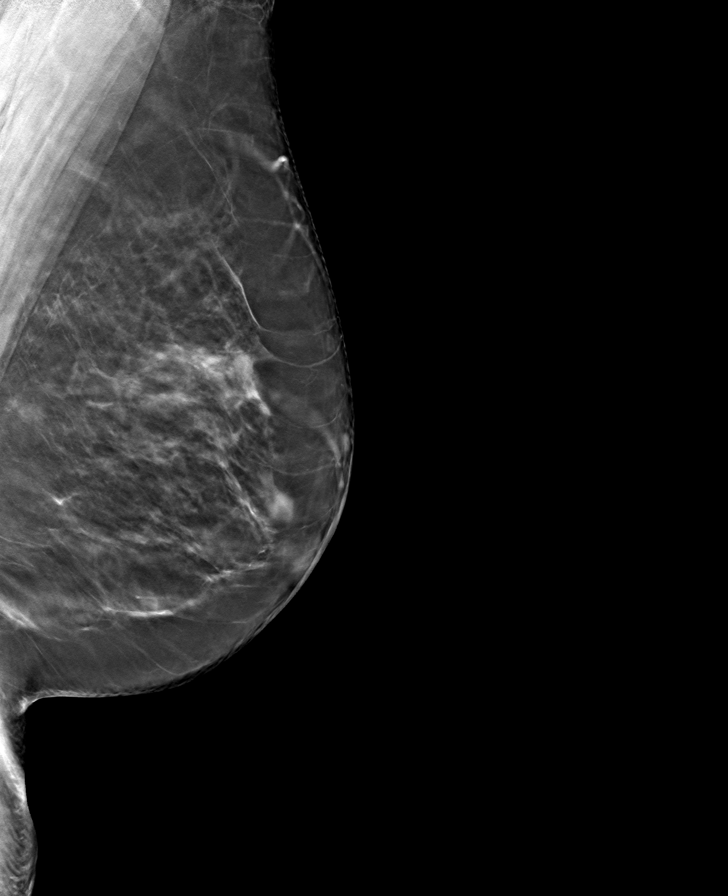

[R MLO tomo · tomo slice 39/78.0]
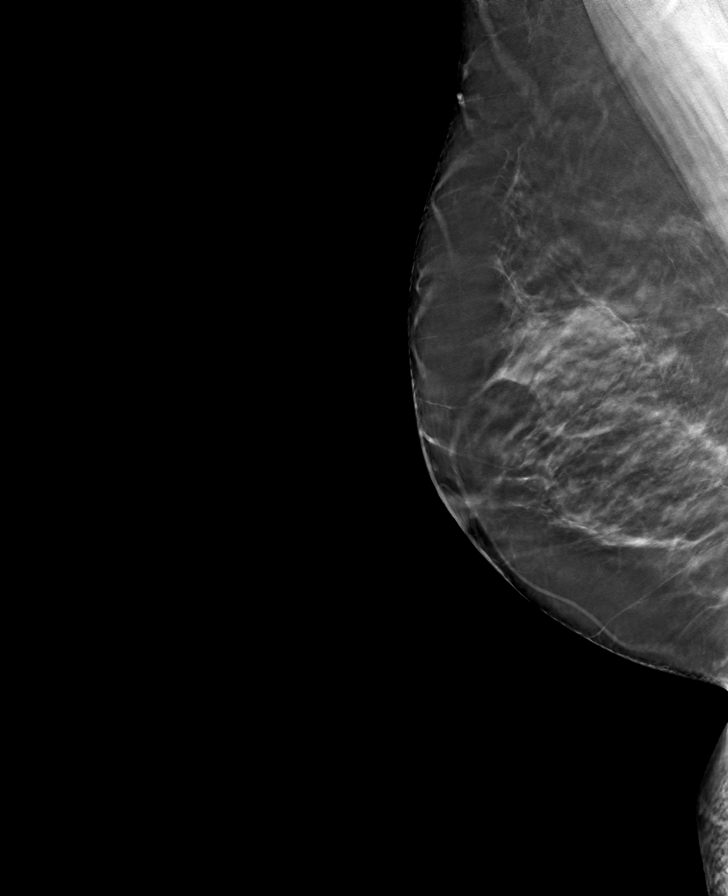

[8 of 24 positions shown; findings below may reference images not displayed]

ACR Breast Density Category b: There are scattered areas of
fibroglandular density.
FINDINGS: There are no findings suspicious for malignancy.
IMPRESSION: No mammographic evidence of malignancy. A result letter of this
screening mammogram will be mailed directly to the patient.

RECOMMENDATION:
Screening mammogram in one year. (Code:51-O-LD2)

BI-RADS CATEGORY  1: Negative.

## 2023-03-29 ENCOUNTER — Ambulatory Visit
Admission: EM | Admit: 2023-03-29 | Discharge: 2023-03-29 | Disposition: A | Discharge status: Home / Self Care | Attending: Internal Medicine | Admitting: Internal Medicine

## 2023-03-29 ENCOUNTER — Encounter: Payer: Self-pay | Admitting: Emergency Medicine

## 2023-03-29 DIAGNOSIS — J029 Acute pharyngitis, unspecified: Secondary | ICD-10-CM | POA: Insufficient documentation

## 2023-03-29 DIAGNOSIS — H9203 Otalgia, bilateral: Secondary | ICD-10-CM | POA: Diagnosis not present

## 2023-03-29 LAB — POCT MONO SCREEN (KUC): Mono, POC: NEGATIVE

## 2023-03-29 LAB — POCT RAPID STREP A (OFFICE): Rapid Strep A Screen: NEGATIVE

## 2023-03-29 MED ORDER — FLUTICASONE PROPIONATE 50 MCG/ACT NA SUSP: 1.0000 | Freq: Every day | NASAL | 0 refills | Status: AC

## 2023-03-29 MED ORDER — CETIRIZINE HCL 10 MG PO TABS: 10.0000 mg | ORAL_TABLET | Freq: Every day | ORAL | 0 refills | Status: AC

## 2023-03-29 MED ORDER — CHLORASEPTIC 1.4 % MT LIQD
1.0000 | OROMUCOSAL | 0 refills | Status: AC | PRN
Start: 2023-03-29 — End: ?

## 2023-03-29 NOTE — ED Triage Notes (Signed)
 Pt presents with a sore throat and right ear pain x 4-5 days. Pt has taken OTC pain medication for her symptoms.

## 2023-03-29 NOTE — Discharge Instructions (Signed)
 Your rapid flu and rapid strep test were negative.  Throat culture is pending as we discussed.  I have prescribed you 3 medications to help alleviate symptoms.  Please follow-up if any symptoms persist or worsen.

## 2023-03-29 NOTE — ED Provider Notes (Signed)
 [name]    CSN: [id number] Arrival date & time: 03/29/23  6440      History   Chief Complaint Chief Complaint  Patient presents with   Sore Throat   Otalgia    HPI AMAURIA Karen Diaz is a 53 y.o. female.   Patient presents with sore throat and right ear pain.  Patient reports that symptoms have been present for multiple weeks but have worsened over the past few days.  Reports that her husband had different viral symptoms a few weeks prior.  She denies any fever.  Has taken over-the-counter ibuprofen and Tylenol with minimal improvement in symptoms. Patient denies history of asthma or COPD.  Reports nasal drainage but denies any coughing.   Sore Throat  Otalgia   Past Medical History:  Diagnosis Date   Gestational diabetes    Hyperlipidemia 2012   Iron deficiency anemia 05/02/2017    Patient Active Problem List   Diagnosis Date Noted   Iron deficiency 03/25/2020   Right shoulder pain 03/25/2020   Alcohol intake above recommended sensible limits 11/19/2019   Skin tag 04/17/2019   Thrombocytosis 04/17/2019   Pre-diabetes 05/02/2017   Palpitations 03/24/2015   Overweight 11/30/2012   Hyperlipidemia 02/01/2011   Healthcare maintenance 02/01/2011    Past Surgical History:  Procedure Laterality Date   APPENDECTOMY  1978   CESAREAN SECTION  1993, 2000   TUBAL LIGATION  2000    OB History   No obstetric history on file.      Home Medications    Prior to Admission medications   Medication Sig Start Date End Date Taking? Authorizing Provider  cetirizine (ZYRTEC) 10 MG tablet Take 1 tablet (10 mg total) by mouth daily. 03/29/23  Yes Hiliana Eilts, Rolly Salter E, FNP  ferrous sulfate 325 (65 FE) MG EC tablet Take 1 tablet (325 mg total) by mouth daily with breakfast. 03/24/20  Yes Maury Dus, MD  fluticasone (FLONASE) 50 MCG/ACT nasal spray Place 1 spray into both nostrils daily. 03/29/23  Yes Alicia Ackert E, FNP  OZEMPIC, 0.25 OR 0.5 MG/DOSE, 2 MG/1.5ML SOPN  SMARTSIG:1 SUB-Q Once a Week 01/12/21  Yes [provider]  phenol (CHLORASEPTIC) 1.4 % LIQD Use as directed 1 spray in the mouth or throat as needed. 03/29/23  Yes Zamauri Nez, Rolly Salter E, FNP  rosuvastatin (CRESTOR) 20 MG tablet Take 1 tablet (20 mg total) by mouth daily. 11/01/19  Yes Sandre Kitty, MD  Biotin 5 MG CAPS Take 1 capsule by mouth daily.    [provider]  metFORMIN (GLUCOPHAGE-XR) 500 MG 24 hr tablet Take 1 tablet (500 mg total) by mouth daily with breakfast. Patient not taking: Reported on 06/11/2021 04/17/19   Oralia Manis, DO  metoprolol tartrate (LOPRESSOR) 100 MG tablet Take 100 mg (1 tablet) two hours prior to CT scan 03/13/20   Little Ishikawa, MD  naproxen (NAPROSYN) 500 MG tablet Take 1 tablet (500 mg total) by mouth 2 (two) times daily with a meal. 03/24/20   Maury Dus, MD    Family History Family History  Problem Relation Age of Onset   Asthma Mother    Cancer Father        colon/breast   Esophageal cancer Father    Breast cancer Paternal Aunt    Colon cancer Paternal Uncle    Breast cancer Maternal Grandmother    Diabetes Maternal Grandmother    Heart failure Maternal Grandmother    Hypertension Maternal Grandmother    Diabetes Maternal Grandfather  Hypertension Maternal Grandfather    Diabetes Paternal Grandmother    Diabetes Paternal Grandfather    Esophageal cancer Paternal Grandfather    Stomach cancer Neg Hx    Rectal cancer Neg Hx     Social History Social History   Tobacco Use   Smoking status: Former   Smokeless tobacco: Never   Tobacco comments:    quit 29 years ago; previously 3-4cig/day  Vaping Use   Vaping status: Never Used  Substance Use Topics   Alcohol use: Yes    Comment: on the weekends; 6 beers throughout the weekend   Drug use: Never     Allergies   Flagyl [metronidazole]   Review of Systems Review of Systems Per HPI  Physical Exam Triage Vital Signs ED Triage Vitals  Encounter Vitals  Group     BP 03/29/23 0852 128/78     Systolic BP Percentile --      Diastolic BP Percentile --      Pulse Rate 03/29/23 0852 77     Resp 03/29/23 0852 16     Temp 03/29/23 0852 97.7 F (36.5 C)     Temp Source 03/29/23 0852 Oral     SpO2 03/29/23 0852 95 %     Weight --      Height --      Head Circumference --      Peak Flow --      Pain Score 03/29/23 0849 8     Pain Loc --      Pain Education --      Exclude from Growth Chart --    No data found.  Updated Vital Signs BP 128/78 (BP Location: Right Arm)   Pulse 77   Temp 97.7 F (36.5 C) (Oral)   Resp 16   LMP 09/25/2020   SpO2 95%   Visual Acuity Right Eye Distance:   Left Eye Distance:   Bilateral Distance:    Right Eye Near:   Left Eye Near:    Bilateral Near:     Physical Exam Constitutional:      General: She is not in acute distress.    Appearance: Normal appearance. She is not toxic-appearing or diaphoretic.  HENT:     Head: Normocephalic and atraumatic.     Right Ear: Ear canal normal. A middle ear effusion is present. Tympanic membrane is not perforated, erythematous or bulging.     Left Ear: Ear canal normal. A middle ear effusion is present. Tympanic membrane is not perforated, erythematous or bulging.     Nose: Congestion present.     Mouth/Throat:     Mouth: Mucous membranes are moist.     Pharynx: Posterior oropharyngeal erythema present.  Eyes:     Extraocular Movements: Extraocular movements intact.     Conjunctiva/sclera: Conjunctivae normal.     Pupils: Pupils are equal, round, and reactive to light.  Cardiovascular:     Rate and Rhythm: Normal rate and regular rhythm.     Pulses: Normal pulses.     Heart sounds: Normal heart sounds.  Pulmonary:     Effort: Pulmonary effort is normal. No respiratory distress.     Breath sounds: Normal breath sounds. No wheezing.  Musculoskeletal:        General: Normal range of motion.     Cervical back: Normal range of motion.  Skin:    General:  Skin is warm and dry.  Neurological:     General: No focal deficit present.  Mental Status: She is alert and oriented to person, place, and time. Mental status is at baseline.  Psychiatric:        Mood and Affect: Mood normal.        Behavior: Behavior normal.      UC Treatments / Results  Labs (all labs ordered are listed, but only abnormal results are displayed) Labs Reviewed  POCT RAPID STREP A (OFFICE) - Normal  POCT MONO SCREEN ([facility]) - Normal  CULTURE, GROUP A STREP [facility])    EKG   Radiology No results found.  Procedures Procedures (including critical care time)  Medications Ordered in UC Medications - No data to display  Initial Impression / Assessment and Plan / UC Course  I have reviewed the triage vital signs and the nursing notes.  Pertinent labs & imaging results that were available during my care of the patient were reviewed by me and considered in my medical decision making (see chart for details).     Rapid strep and rapid mono were negative.  Throat culture is pending.  Given appearance of posterior pharynx on exam, I do have a low concern for bacteria/strep throat.  Could be viral illness versus allergies.  Will defer viral testing given duration of symptoms as it would not change treatment.  Will treat symptomatically for fluid behind TMs and acute pharyngitis with Flonase, Chloraseptic spray, cetirizine antihistamine as patient denies that she uses any of these daily.  Advised strict follow-up if any symptoms persist or worsen.  Patient verbalized understanding and was agreeable with plan. Final Clinical Impressions(s) / UC Diagnoses   Final diagnoses:  Acute pharyngitis, unspecified etiology  Acute ear pain, bilateral     Discharge Instructions      Your rapid flu and rapid strep test were negative.  Throat culture is pending as we discussed.  I have prescribed you 3 medications to help alleviate symptoms.  Please follow-up if any symptoms  persist or worsen.    ED Prescriptions     Medication Sig Dispense Auth. Provider   fluticasone (FLONASE) 50 MCG/ACT nasal spray Place 1 spray into both nostrils daily. 16 g Ervin Knack E, [place]   cetirizine (ZYRTEC) 10 MG tablet Take 1 tablet (10 mg total) by mouth daily. 30 tablet Maddock, Fabrica E, [place]   phenol (CHLORASEPTIC) 1.4 % LIQD Use as directed 1 spray in the mouth or throat as needed. 118 mL Gustavus Bryant, [place]      PDMP not reviewed this encounter.   Gustavus Bryant, [place] 03/29/23 1014

## 2023-03-31 LAB — CULTURE, GROUP A STREP (THRC)
